# Patient Record
Sex: Female | Born: 1970 | Race: White | Hispanic: No | Marital: Single | Smoking: Never smoker
Health system: Southern US, Community
[De-identification: ages and names within clinical notes are randomized; demographics above are authoritative.]

## PROBLEM LIST (undated history)

## (undated) DIAGNOSIS — Z9889 Other specified postprocedural states: Secondary | ICD-10-CM

## (undated) DIAGNOSIS — F329 Major depressive disorder, single episode, unspecified: Secondary | ICD-10-CM

## (undated) DIAGNOSIS — D509 Iron deficiency anemia, unspecified: Secondary | ICD-10-CM

## (undated) DIAGNOSIS — I10 Essential (primary) hypertension: Secondary | ICD-10-CM

## (undated) DIAGNOSIS — F32A Depression, unspecified: Secondary | ICD-10-CM

## (undated) DIAGNOSIS — F419 Anxiety disorder, unspecified: Secondary | ICD-10-CM

## (undated) DIAGNOSIS — M1990 Unspecified osteoarthritis, unspecified site: Secondary | ICD-10-CM

## (undated) DIAGNOSIS — R079 Chest pain, unspecified: Secondary | ICD-10-CM

## (undated) DIAGNOSIS — R112 Nausea with vomiting, unspecified: Secondary | ICD-10-CM

## (undated) DIAGNOSIS — N200 Calculus of kidney: Secondary | ICD-10-CM

## (undated) DIAGNOSIS — K219 Gastro-esophageal reflux disease without esophagitis: Secondary | ICD-10-CM

## (undated) DIAGNOSIS — E669 Obesity, unspecified: Secondary | ICD-10-CM

## (undated) DIAGNOSIS — J189 Pneumonia, unspecified organism: Secondary | ICD-10-CM

## (undated) HISTORY — DX: Anxiety disorder, unspecified: F41.9

## (undated) HISTORY — DX: Essential (primary) hypertension: I10

## (undated) HISTORY — DX: Gastro-esophageal reflux disease without esophagitis: K21.9

## (undated) HISTORY — DX: Depression, unspecified: F32.A

## (undated) HISTORY — DX: Obesity, unspecified: E66.9

## (undated) HISTORY — DX: Major depressive disorder, single episode, unspecified: F32.9

## (undated) HISTORY — DX: Unspecified osteoarthritis, unspecified site: M19.90

---

## 1997-10-21 ENCOUNTER — Other Ambulatory Visit: Admission: RE | Admit: 1997-10-21 | Discharge: 1997-10-21 | Payer: Self-pay | Admitting: Obstetrics and Gynecology

## 1997-11-13 ENCOUNTER — Other Ambulatory Visit: Admission: RE | Admit: 1997-11-13 | Discharge: 1997-11-13 | Payer: Self-pay | Admitting: Obstetrics and Gynecology

## 1997-12-14 ENCOUNTER — Other Ambulatory Visit: Admission: RE | Admit: 1997-12-14 | Discharge: 1997-12-14 | Payer: Self-pay | Admitting: Obstetrics and Gynecology

## 1998-03-31 ENCOUNTER — Other Ambulatory Visit: Admission: RE | Admit: 1998-03-31 | Discharge: 1998-03-31 | Payer: Self-pay | Admitting: Obstetrics and Gynecology

## 1998-06-07 ENCOUNTER — Other Ambulatory Visit: Admission: RE | Admit: 1998-06-07 | Discharge: 1998-06-07 | Payer: Self-pay | Admitting: Obstetrics and Gynecology

## 1998-10-01 DIAGNOSIS — J189 Pneumonia, unspecified organism: Secondary | ICD-10-CM

## 1998-10-01 HISTORY — DX: Pneumonia, unspecified organism: J18.9

## 1998-10-26 ENCOUNTER — Other Ambulatory Visit: Admission: RE | Admit: 1998-10-26 | Discharge: 1998-10-26 | Payer: Self-pay | Admitting: Obstetrics and Gynecology

## 1998-11-02 ENCOUNTER — Encounter: Admission: RE | Admit: 1998-11-02 | Discharge: 1999-01-31 | Payer: Self-pay | Admitting: Obstetrics & Gynecology

## 1999-01-31 HISTORY — PX: BREAST REDUCTION SURGERY: SHX8

## 1999-03-11 ENCOUNTER — Other Ambulatory Visit: Admission: RE | Admit: 1999-03-11 | Discharge: 1999-03-11 | Payer: Self-pay | Admitting: Obstetrics and Gynecology

## 1999-04-12 ENCOUNTER — Encounter (INDEPENDENT_AMBULATORY_CARE_PROVIDER_SITE_OTHER): Payer: Self-pay

## 1999-04-12 ENCOUNTER — Other Ambulatory Visit: Admission: RE | Admit: 1999-04-12 | Discharge: 1999-04-12 | Payer: Self-pay | Admitting: Unknown Physician Specialty

## 1999-08-22 ENCOUNTER — Other Ambulatory Visit: Admission: RE | Admit: 1999-08-22 | Discharge: 1999-08-22 | Payer: Self-pay | Admitting: Plastic Surgery

## 1999-11-11 ENCOUNTER — Other Ambulatory Visit: Admission: RE | Admit: 1999-11-11 | Discharge: 1999-11-11 | Payer: Self-pay | Admitting: Obstetrics and Gynecology

## 2002-05-26 ENCOUNTER — Other Ambulatory Visit: Admission: RE | Admit: 2002-05-26 | Discharge: 2002-05-26 | Payer: Self-pay | Admitting: Obstetrics and Gynecology

## 2002-07-16 ENCOUNTER — Encounter: Payer: Self-pay | Admitting: Obstetrics and Gynecology

## 2002-07-16 ENCOUNTER — Ambulatory Visit (HOSPITAL_COMMUNITY): Admission: RE | Admit: 2002-07-16 | Discharge: 2002-07-16 | Payer: Self-pay | Admitting: Obstetrics and Gynecology

## 2002-08-14 ENCOUNTER — Ambulatory Visit (HOSPITAL_COMMUNITY): Admission: RE | Admit: 2002-08-14 | Discharge: 2002-08-14 | Payer: Self-pay | Admitting: Obstetrics and Gynecology

## 2002-08-14 ENCOUNTER — Encounter: Payer: Self-pay | Admitting: Obstetrics and Gynecology

## 2002-09-16 ENCOUNTER — Encounter: Payer: Self-pay | Admitting: Obstetrics and Gynecology

## 2002-09-16 ENCOUNTER — Ambulatory Visit (HOSPITAL_COMMUNITY): Admission: RE | Admit: 2002-09-16 | Discharge: 2002-09-16 | Payer: Self-pay | Admitting: Obstetrics and Gynecology

## 2002-10-28 ENCOUNTER — Ambulatory Visit (HOSPITAL_COMMUNITY): Admission: RE | Admit: 2002-10-28 | Discharge: 2002-10-28 | Payer: Self-pay | Admitting: Obstetrics and Gynecology

## 2002-10-28 ENCOUNTER — Encounter: Payer: Self-pay | Admitting: Obstetrics and Gynecology

## 2002-11-05 ENCOUNTER — Inpatient Hospital Stay (HOSPITAL_COMMUNITY): Admission: AD | Admit: 2002-11-05 | Discharge: 2002-11-05 | Payer: Self-pay | Admitting: Obstetrics and Gynecology

## 2002-11-07 ENCOUNTER — Encounter: Admission: RE | Admit: 2002-11-07 | Discharge: 2002-11-07 | Payer: Self-pay | Admitting: Obstetrics and Gynecology

## 2002-11-10 ENCOUNTER — Encounter: Admission: RE | Admit: 2002-11-10 | Discharge: 2002-11-10 | Payer: Self-pay | Admitting: Obstetrics and Gynecology

## 2002-11-13 ENCOUNTER — Encounter: Admission: RE | Admit: 2002-11-13 | Discharge: 2002-11-13 | Payer: Self-pay | Admitting: Obstetrics and Gynecology

## 2002-11-18 ENCOUNTER — Encounter: Admission: RE | Admit: 2002-11-18 | Discharge: 2002-11-18 | Payer: Self-pay | Admitting: Obstetrics and Gynecology

## 2002-11-21 ENCOUNTER — Inpatient Hospital Stay (HOSPITAL_COMMUNITY): Admission: RE | Admit: 2002-11-21 | Discharge: 2002-11-24 | Payer: Self-pay | Admitting: Obstetrics and Gynecology

## 2002-11-21 ENCOUNTER — Encounter (INDEPENDENT_AMBULATORY_CARE_PROVIDER_SITE_OTHER): Payer: Self-pay

## 2003-08-12 ENCOUNTER — Other Ambulatory Visit: Admission: RE | Admit: 2003-08-12 | Discharge: 2003-08-12 | Payer: Self-pay | Admitting: Gynecology

## 2003-08-26 ENCOUNTER — Encounter: Admission: RE | Admit: 2003-08-26 | Discharge: 2003-08-26 | Payer: Self-pay | Admitting: Neurosurgery

## 2003-09-18 ENCOUNTER — Encounter: Admission: RE | Admit: 2003-09-18 | Discharge: 2003-09-18 | Payer: Self-pay | Admitting: Neurosurgery

## 2003-10-16 ENCOUNTER — Encounter: Admission: RE | Admit: 2003-10-16 | Discharge: 2003-10-16 | Payer: Self-pay | Admitting: Neurosurgery

## 2004-01-31 HISTORY — PX: LUMBAR MICRODISCECTOMY: SHX99

## 2004-02-19 ENCOUNTER — Ambulatory Visit (HOSPITAL_COMMUNITY): Admission: RE | Admit: 2004-02-19 | Discharge: 2004-02-19 | Payer: Self-pay | Admitting: Neurosurgery

## 2004-03-13 ENCOUNTER — Emergency Department (HOSPITAL_COMMUNITY): Admission: EM | Admit: 2004-03-13 | Discharge: 2004-03-13 | Payer: Self-pay | Admitting: Emergency Medicine

## 2004-03-14 ENCOUNTER — Inpatient Hospital Stay (HOSPITAL_COMMUNITY): Admission: AD | Admit: 2004-03-14 | Discharge: 2004-03-16 | Payer: Self-pay | Admitting: Neurosurgery

## 2004-09-12 ENCOUNTER — Other Ambulatory Visit: Admission: RE | Admit: 2004-09-12 | Discharge: 2004-09-12 | Payer: Self-pay | Admitting: Gynecology

## 2005-04-12 ENCOUNTER — Encounter: Admission: RE | Admit: 2005-04-12 | Discharge: 2005-04-12 | Payer: Self-pay | Admitting: Gynecology

## 2005-11-03 ENCOUNTER — Other Ambulatory Visit: Admission: RE | Admit: 2005-11-03 | Discharge: 2005-11-03 | Payer: Self-pay | Admitting: Gynecology

## 2006-01-30 HISTORY — PX: CHOLECYSTECTOMY: SHX55

## 2006-06-01 ENCOUNTER — Ambulatory Visit (HOSPITAL_COMMUNITY): Admission: RE | Admit: 2006-06-01 | Discharge: 2006-06-01 | Payer: Self-pay | Admitting: General Surgery

## 2006-06-22 ENCOUNTER — Encounter: Admission: RE | Admit: 2006-06-22 | Discharge: 2006-06-22 | Payer: Self-pay | Admitting: Gynecology

## 2006-11-30 ENCOUNTER — Other Ambulatory Visit: Admission: RE | Admit: 2006-11-30 | Discharge: 2006-11-30 | Payer: Self-pay | Admitting: Gynecology

## 2007-01-31 HISTORY — PX: LAPAROSCOPIC GASTRIC BANDING: SHX1100

## 2007-04-25 ENCOUNTER — Encounter: Admission: RE | Admit: 2007-04-25 | Discharge: 2007-07-24 | Payer: Self-pay | Admitting: Gynecology

## 2007-05-13 ENCOUNTER — Ambulatory Visit (HOSPITAL_COMMUNITY): Admission: RE | Admit: 2007-05-13 | Discharge: 2007-05-14 | Payer: Self-pay | Admitting: Surgery

## 2008-01-10 ENCOUNTER — Other Ambulatory Visit: Admission: RE | Admit: 2008-01-10 | Discharge: 2008-01-10 | Payer: Self-pay | Admitting: Gynecology

## 2008-01-31 HISTORY — PX: IUD REMOVAL: SHX5392

## 2009-09-02 ENCOUNTER — Observation Stay (HOSPITAL_COMMUNITY): Admission: EM | Admit: 2009-09-02 | Discharge: 2009-09-03 | Payer: Self-pay | Admitting: Emergency Medicine

## 2009-10-06 ENCOUNTER — Ambulatory Visit: Payer: Self-pay | Admitting: Cardiology

## 2009-11-26 ENCOUNTER — Encounter: Admission: RE | Admit: 2009-11-26 | Discharge: 2009-11-26 | Payer: Self-pay | Admitting: General Surgery

## 2010-02-19 ENCOUNTER — Encounter: Payer: Self-pay | Admitting: Neurosurgery

## 2010-02-20 ENCOUNTER — Emergency Department (HOSPITAL_COMMUNITY)
Admission: EM | Admit: 2010-02-20 | Discharge: 2010-02-20 | Payer: Self-pay | Source: Home / Self Care | Admitting: Emergency Medicine

## 2010-03-05 ENCOUNTER — Emergency Department (HOSPITAL_COMMUNITY)
Admission: EM | Admit: 2010-03-05 | Discharge: 2010-03-06 | Disposition: A | Discharge status: Home / Self Care | Payer: PRIVATE HEALTH INSURANCE | Attending: Emergency Medicine | Admitting: Emergency Medicine

## 2010-03-05 DIAGNOSIS — J45909 Unspecified asthma, uncomplicated: Secondary | ICD-10-CM | POA: Insufficient documentation

## 2010-03-05 DIAGNOSIS — R000 Tachycardia, unspecified: Secondary | ICD-10-CM | POA: Insufficient documentation

## 2010-03-05 DIAGNOSIS — R0602 Shortness of breath: Secondary | ICD-10-CM | POA: Insufficient documentation

## 2010-03-08 ENCOUNTER — Other Ambulatory Visit: Payer: Self-pay | Admitting: Gastroenterology

## 2010-03-15 ENCOUNTER — Ambulatory Visit
Admission: RE | Admit: 2010-03-15 | Discharge: 2010-03-15 | Disposition: A | Discharge status: Home / Self Care | Payer: PRIVATE HEALTH INSURANCE | Source: Ambulatory Visit | Attending: Gastroenterology | Admitting: Gastroenterology

## 2010-03-15 ENCOUNTER — Ambulatory Visit: Payer: PRIVATE HEALTH INSURANCE

## 2010-03-15 ENCOUNTER — Other Ambulatory Visit: Payer: Self-pay | Admitting: Gastroenterology

## 2010-04-15 LAB — URINALYSIS, ROUTINE W REFLEX MICROSCOPIC
Bilirubin Urine: NEGATIVE
Glucose, UA: NEGATIVE mg/dL
Ketones, ur: NEGATIVE mg/dL
Urobilinogen, UA: 0.2 mg/dL (ref 0.0–1.0)
pH: 8 (ref 5.0–8.0)

## 2010-04-15 LAB — CK TOTAL AND CKMB (NOT AT ARMC): Relative Index: INVALID (ref 0.0–2.5)

## 2010-04-15 LAB — BASIC METABOLIC PANEL
BUN: 10 mg/dL (ref 6–23)
CO2: 28 mEq/L (ref 19–32)
Calcium: 8.1 mg/dL — ABNORMAL LOW (ref 8.4–10.5)
Chloride: 102 mEq/L (ref 96–112)
Chloride: 103 mEq/L (ref 96–112)
Creatinine, Ser: 0.84 mg/dL (ref 0.4–1.2)
Creatinine, Ser: 0.85 mg/dL (ref 0.4–1.2)
GFR calc Af Amer: >60 mL/min (ref >60)
GFR calc non Af Amer: >60 mL/min (ref >60)
Glucose, Bld: 87 mg/dL (ref 70–99)
Sodium: 136 mEq/L (ref 135–145)
Sodium: 139 mEq/L (ref 135–145)

## 2010-04-15 LAB — GLUCOSE, CAPILLARY
Glucose-Capillary: 114 mg/dL — ABNORMAL HIGH (ref 70–99)
Glucose-Capillary: 99 mg/dL (ref 70–99)

## 2010-04-15 LAB — POCT CARDIAC MARKERS: Myoglobin, poc: 73.9 ng/mL (ref 12–200)

## 2010-04-15 LAB — TSH: TSH: 1.158 u[IU]/mL (ref 0.350–4.500)

## 2010-04-15 LAB — CBC
HCT: 41.7 % (ref 36.0–46.0)
Hemoglobin: 13 g/dL (ref 12.0–15.0)
Hemoglobin: 13.8 g/dL (ref 12.0–15.0)
MCH: 30.9 pg (ref 26.0–34.0)
MCV: 93.1 fL (ref 78.0–100.0)
Platelets: 271 10*3/uL (ref 150–400)
Platelets: 304 10*3/uL (ref 150–400)
RBC: 4.21 MIL/uL (ref 3.87–5.11)
WBC: 12 10*3/uL — ABNORMAL HIGH (ref 4.0–10.5)

## 2010-04-15 LAB — COMPREHENSIVE METABOLIC PANEL
ALT: 18 U/L (ref 0–35)
Albumin: 3.6 g/dL (ref 3.5–5.2)
GFR calc Af Amer: >60 mL/min (ref >60)
Potassium: 4.6 mEq/L (ref 3.5–5.1)
Total Protein: 6.8 g/dL (ref 6.0–8.3)

## 2010-04-15 LAB — CARDIAC PANEL(CRET KIN+CKTOT+MB+TROPI)
Relative Index: INVALID (ref 0.0–2.5)
Relative Index: INVALID (ref 0.0–2.5)
Total CK: 29 U/L (ref 7–177)
Troponin I: 0.01 ng/mL (ref 0.00–0.06)

## 2010-04-15 LAB — DIFFERENTIAL: Monocytes Absolute: 0.6 10*3/uL (ref 0.1–1.0)

## 2010-04-15 LAB — D-DIMER, QUANTITATIVE: D-Dimer, Quant: 0.22 ug/mL-FEU (ref 0.00–0.48)

## 2010-04-15 LAB — PROTIME-INR: Prothrombin Time: 12.9 seconds (ref 11.6–15.2)

## 2010-04-15 LAB — LIPID PANEL
Cholesterol: 154 mg/dL (ref 0–200)
HDL: 61 mg/dL (ref >39)
Total CHOL/HDL Ratio: 2.5 RATIO
Triglycerides: 117 mg/dL (ref <150)

## 2010-04-15 LAB — MRSA PCR SCREENING: MRSA by PCR: NEGATIVE

## 2010-04-15 LAB — HEMOGLOBIN A1C: Mean Plasma Glucose: 103 mg/dL (ref <117)

## 2010-05-17 ENCOUNTER — Other Ambulatory Visit: Payer: Self-pay | Admitting: Gynecology

## 2010-05-17 DIAGNOSIS — N6312 Unspecified lump in the right breast, upper inner quadrant: Secondary | ICD-10-CM

## 2010-05-23 ENCOUNTER — Ambulatory Visit
Admission: RE | Admit: 2010-05-23 | Discharge: 2010-05-23 | Disposition: A | Discharge status: Home / Self Care | Payer: PRIVATE HEALTH INSURANCE | Source: Ambulatory Visit | Attending: Gynecology | Admitting: Gynecology

## 2010-05-23 ENCOUNTER — Other Ambulatory Visit: Payer: Self-pay | Admitting: Gynecology

## 2010-05-23 DIAGNOSIS — N6312 Unspecified lump in the right breast, upper inner quadrant: Secondary | ICD-10-CM

## 2010-06-14 NOTE — Op Note (Signed)
Natasha [name]                 ACCOUNT NO.:  [id number]   MEDICAL RECORD NO.:  [id number]          PATIENT TYPE:  AMB   LOCATION:  DAY                          FACILITY:  [facility]   PHYSICIAN:  Sharlet Salina T. [name], M.D.DATE OF BIRTH:  27-May-1970   DATE OF PROCEDURE:  05/13/2007  DATE OF DISCHARGE:                               OPERATIVE REPORT   PREOPERATIVE DIAGNOSIS:  Morbid obesity.   POSTOPERATIVE DIAGNOSIS:  Morbid obesity.   SURGICAL PROCEDURES:  Placement of laparoscopic adjustable gastric band.   SURGEON:  Natasha [name], M.D.   ASSISTANT:  Thornton Park. Daphine Deutscher, M.D.   ANESTHESIA:  General.   BRIEF HISTORY:  Natasha [name] is a 40 year old female with progressive  morbid obesity unresponsive to medical management and several  comorbidities.  After extensive preoperative workup and discussion  detailed elsewhere, we have elected to proceed with placement of  laparoscopic adjustable gastric band.   DESCRIPTION OF OPERATION:  The patient was brought to the operating room  and placed in the supine position on the operating table, and general  orotracheal anesthesia was induced.  She received preoperative IV  antibiotics.  PAS were in place.  Subcutaneous heparin had been  administered preoperatively.  The abdomen was widely sterilely prepped  and draped.  Correct patient and procedure were verified.  Access was  obtained in the left subcostal space with 11-mm OptiVu trocar without  difficulty.  Under direct vision, a 15-mm trocar was placed in the right  upper quadrant laterally, an 11-mm trocar in the right upper mid  abdomen, and an 11-mm trocar just above and to the left of the umbilicus  for the camera port.  Through a 5-mm subxiphoid port, the Nathanson  retractor was placed, and the left lobe of liver elevated with excellent  exposure of the hiatus and upper stomach.  Finally, a 5-mm trocar was  placed in the left flank.  Upper GI series had shown no evidence of  hiatal hernia, and there did not appear to be any gross evidence of  this.  The peritoneum at the angle of His overlying the left crus was  incised, and blunt dissection carried back along the left crus toward  the retrogastric space.  The gastrohepatic omentum was then divided in  an avascular area, and the crossing fat was identified at the base of  the right crus.  Peritoneum was incised with cautery, and using the  finger dissector, this passed easily into the retrogastric space and was  deployed up to the previously dissected area of the angle of His without  difficulty.  A prefilled AP Standard lap band system was introduced,  tubing passed through the finger dissector which was then brought back  through the retrogastric space without difficulty as was the band.  The  sizing tube was placed orally into the stomach, and the band was clipped  into place without any undue tension.  The sizing tube was removed.  Holding the tubing toward the patient's feet, the fundus was imbricated  up over the band to the small gastric pouch with several interrupted  2-0  Ethibond sutures.  The operative site was inspected for hemostasis which  appeared complete.  There was no evidence of trocar injury.  The tubing  was brought out through the right mid abdominal trocar site.  The  Nathanson retractor was removed, all CO2 evacuated, trocars removed.  This right mid abdominal incision was lengthened somewhat and the  anterior fascia exposed for placement of the port.  The tubing was  trimmed, attached to the port, which was then sutured to the  anterior abdominal wall with 4 interrupted Prolene sutures.  Following  this, this wound was closed with running subcutaneous 3-0 Vicryl, and  skin incisions were closed with subcuticular 4-0 Monocryl with  Dermabond.  Sponge, needle, and instrument counts correct.  The patient  was taken to recovery in good condition.      Natasha [name], M.D.   Electronically Signed     BTH/MEDQ  D:  05/13/2007  T:  05/13/2007  Job:  [id number]

## 2010-06-17 NOTE — Op Note (Signed)
NAMESHAWNIA, VIZCARRONDO NO.:  [id number]   MEDICAL RECORD NO.:  [id number]                   PATIENT TYPE:  INP   LOCATION:  9198                                 FACILITY:  [facility]   PHYSICIAN:  Huel Cote, M.D.              DATE OF BIRTH:  1970/11/14   DATE OF PROCEDURE:  11/21/2002  DATE OF DISCHARGE:                                 OPERATIVE REPORT   PREOPERATIVE DIAGNOSES:  1. Term pregnancy at 37+ weeks, delivered.  2. Twin gestation.  3. Breech-vertex position.   POSTOPERATIVE DIAGNOSES:  1. Term pregnancy at 37+ weeks, delivered.  2. Twin gestation.  3. Breech-vertex position.   PROCEDURE:  Low transverse cesarean section.   SURGEON:  Huel Cote, M.D., with Leatha Gilding. Mezer, M.D.   ANESTHESIA:  Spinal.   ESTIMATED BLOOD LOSS:  1000 mL.   URINE OUTPUT:  200 mL clear urine.   FLUIDS REPLACED:  Approximately 1700 mL LR.   FINDINGS:  There was a vigorous female infant, baby A, in the breech  presentation, 6 pounds 5 ounces; a vigorous female, baby B, in the vertex  presentation, 6 pounds 8 ounces.  The placenta was normal.  Tubes and  ovaries were normal.   DESCRIPTION OF PROCEDURE:  The patient was taken to the operating room,  where spinal anesthesia was obtained without difficulty.  She was then  prepped and draped in the normal sterile fashion in the dorsal supine  position with a leftward tilt.  A Pfannenstiel skin incision was then made  with a scalpel and carried through to the underlying layer of fascia by  sharp dissection and Bovie cautery.  The fascia was then nicked in the  midline and the incision was extended laterally with Mayo scissors.  The  inferior aspect of the fascia was then grasped with Kocher clamps, elevated,  and dissected off the underlying rectus muscles.  In a similar fashion the  superior aspect was elevated and dissected off the rectus muscles.  The  rectus muscles were separated in the midline and  the peritoneum identified  and entered bluntly.  The peritoneal incision was extended both superiorly  and inferiorly with careful attention to avoid both bowel and bladder.  The  bladder blade was inserted.  The vesicouterine peritoneum was identified.  The uterus was noted to be rotated to the patient's left, and this was  adjusted for and a bladder flap created with Metzenbaum scissors.  The  bladder blade was placed underneath the bladder flap and the uterus incised  in a transverse fashion.  The cavity itself was entered bluntly and clear  fluid noted.  Baby A was noted to be in the breech presentation, frank  breech with the buttocks presenting.  These were easily elevated and  delivered with the legs reduced and the arms reduced and the head delivered  easily.  The nose and  mouth were bulb-suctioned, the cord clamped and cut,  and the baby handed to the waiting pediatricians.  Baby B was then palpated  and was noted to be in the vertex presentation.  Head was elevated through  the incision and the bag on baby B was opened with a hemostat.  The vertex  was easily delivered, the nose and mouth bulb-suctioned, and the remainder  of the infant's body delivered atraumatically.  The cord was clamped and cut  and the baby was taken to the [facility] as well.  At this time a cord blood  was obtained from cord A, and this was marked with a plastic umbilical  clamp.  Then a cord blood was attempted to be obtained from baby B, which  was a two-vessel cord, and some blood was obtained though not a large  amount.  The placenta was then delivered manually and the uterus cleared of  all clots and debris with a moist lap sponge.  The uterine incision was then  grasped with ring forceps and repaired with 0 chromic in a running locked  fashion.  Excellent hemostasis was noted after the closure.  The gutters  were cleared of all clots and debris with a moist lap sponge.  Tubes and  ovaries were inspected  and found to be normal.  The bladder flap was  inspected and found to be normal.  There was no active bleeding noted;  therefore, all instruments and sponges were removed from the abdomen after  irrigation was performed.  The muscles were reapproximated with 0 Vicryl in  several interrupted sutures.  The fascia was then closed with 0 Vicryl in a  running fashion.  Subcutaneous tissue was reapproximated with 0 plain in a  running fashion, and the skin was closed with staples.  Sponge, lap, and  needle counts were again correct x2, and the patient was taken to the  recovery room in stable condition.                                                Huel Cote, M.D.    KR/MEDQ  D:  11/21/2002  T:  11/21/2002  Job:  [id number]

## 2010-06-17 NOTE — Op Note (Signed)
[name], [name] NO.:  [id number]   MEDICAL RECORD NO.:  [id number]          PATIENT TYPE:  OIB   LOCATION:  2899                         FACILITY:  [facility]   PHYSICIAN:  Kathaleen Maser. Pool, M.D.    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   PROCEDURE:  Right L5-S1 laminotomy and microdiskectomy.   SURGEON:  Kathaleen Maser. Pool, M.D.   ASSISTANT:  Reinaldo Meeker, M.D.   ANESTHESIA:  General endotracheal anesthesia.   INDICATIONS FOR PROCEDURE:  [name] is a 40 year old female with history  of back and bilateral lower extremity pain, right greater than left.  Work-  ups demonstrated evidence of a large right paracentral disk herniation at L5-  S1 with compression of thecal sac and both exiting S1 nerve roots.  We  discussed the options for management including undergoing a right-sided L5-  S1 laminotomy and microdiskectomy in hopes of improving her symptoms. The  patient aware of the risks and benefits involved with surgery and wishes to  proceed.   DESCRIPTION OF PROCEDURE:  The patient taken to the operating room and  placed in the supine position.  After adequate level of anesthesia achieved,  the patient was positioned prone onto the Wilson frame and appropriately  padded. The patient's lumbar region was prepped and draped sterilely.  A 10  blade was used to make a linear skin incision overlying the L5-S1  interspace.  This was carried down sharply in the midline.  A subperiosteal  dissection performed, exposing the lamina of facet joints at L5 and S1 along  the right.  A deep self-retaining retractor was placed.  Intraoperative x-  ray was taken and level was confirmed.  Laminotomy was then  performed using  high speed drill and Kerrison rongeurs to removal the inferior aspect of the  lamina of L5, medial aspect of L5-S1 facet joint, and the superior rim of  the S1 lamina.  Ligamentum flavum was then elevated and resected in  piecemeal fashion using Kerrison rongeurs.  Underlying thecal sac was then  identified as was the exiting right-sided S1 nerve root.  Epidural venous  plexus was coagulated and cut.  Microscope was brought into the field and  used throughout the remainder of the diskectomy for microdissection.  Thecal  sac and S1 nerve root were mobilized and retracted toward the midline.  Disk  herniation was readily apparent.  This was then incised with a 15 blade in a  rectangular fashion.  A wide disk space clean-out was achieved using  pituitary rongeurs, up-angled pituitary rongeurs, and Epstein curettes.  All  loose or obviously degenerative disk material was removed from the  interspace.  All elements of the disk herniation were completely resected.  At this point a very thorough decompression was achieved.  There was no  evidence of injury  to thecal or nerve roots.  Wound was then irrigated with  antibiotic solution.  Gelfoam was placed for topically for hemostasis.  Microscope and retractors were removed.  Hemostasis  in the muscle achieved with electrocautery.  The wound was then closed in  layers with Vicryl sutures.  Steri-Strips and sterile dressing were applied.  There were no perioperative complaints.  The patient tolerated the procedure  well and she returned to the recovery room postoperatively.       HAP/MEDQ  D:  02/19/2004  T:  02/19/2004  Job:  [id number]

## 2010-06-17 NOTE — Discharge Summary (Signed)
Natasha [name], Natasha [name]                             ACCOUNT NO.:  [id number]   MEDICAL RECORD NO.:  [id number]                   PATIENT TYPE:  INP   LOCATION:  9131                                 FACILITY:  [facility]   PHYSICIAN:  Huel Cote, M.D.              DATE OF BIRTH:  11-22-1970   DATE OF ADMISSION:  11/21/2002  DATE OF DISCHARGE:  11/24/2002                                 DISCHARGE SUMMARY   DISCHARGE DIAGNOSES:  1. Term pregnancy at 37 plus weeks delivered.  2. Twin gestation.  3. Breech vertex presentation.  4. Status post low transverse cesarean section.   DISCHARGE MEDICATIONS:  1. Motrin 600 mg p.o. q.6h. p.r.n.  2. Percocet 1-2 tablets p.o. q.4h. p.r.n.  3. Pyridium one tablet p.o. three times daily.   HOSPITAL COURSE:  Briefly, the patient was a 39 year old, G2, P0-0-1-0, who  was admitted for a scheduled cesarean section, given a twin pregnancy with a  breech vertex presentation of the infants.  She underwent the surgery on  November 21, 2002 without difficulty, and was delivered of a vigorous female  infant from the breech presentation as baby A, 6 pounds, 5 ounces, and  delivered a vigorous female infant in the vertex presentation, baby B, which  was 6 pounds, 8 ounces.  She had a normal uterus, tubes, and ovaries, and  was then admitted for routine postoperative care.  Her postoperative  hemoglobin was 8.8.  Her blood pressures remained stable, and she restarted  her Aldomet for her chronic hypertension.  Prior to discharge, on  postoperative day #3, she was doing very well.  She was having some bladder  spasm, which responded to Pyridium.  She was tolerating a regular diet, and  her pain was well controlled.  She was afebrile with stable vital signs.  Abdomen was soft and nontender.  Her incision was clear with no erythema  noted, and steri-strips were placed, and she was felt stable for discharge  home.                                               Huel Cote, M.D.    KR/MEDQ  D:  12/30/2002  T:  12/30/2002  Job:  [id number]

## 2010-06-17 NOTE — H&P (Signed)
Natasha [name], HOEY NO.:  [id number]   MEDICAL RECORD NO.:  [id number]                   PATIENT TYPE:  INP   LOCATION:  NA                                   FACILITY[facility]   PHYSICIAN:  Huel Cote, M.D.              DATE OF BIRTH:  11-Aug-1970   DATE OF ADMISSION:  11/21/2002  DATE OF DISCHARGE:                                HISTORY & PHYSICAL   PREOPERATIVE HISTORY AND PHYSICAL:   DATE OF SURGERY:  Surgery to take place on November 21, 2002 at [facility] at 9 a.m.   HISTORY OF PRESENT ILLNESS:  The patient is a 40 year old G2, P0-0-1-0, who  is coming in at [redacted] weeks gestation for a scheduled cesarean section for twin  gestation and some mildly elevated blood pressures with a history of chronic  hypertension.  The patient's estimated due date is December 08, 2002, this is  calculated by a known date of conception with insemination performed with  donor sperm.  Her pregnancy is as stated a twin pregnancy and has been  complicated by a two vessel cord in twin B with no other abnormalities  found.  This has been followed by nonstress test since [redacted] weeks gestation.  The patient also has her prenatal care complicated by a history of chronic  hypertension.  This has been very stable and well controlled throughout the  pregnancy on Aldomet 500 mg b.i.d., she also has a history of asthma  however, this has remained fairly stable with only one flare requiring  steroids in her pregnancy.  The patient has concordantly grown twins.  On  October 29, 2002 estimated fetal weight of twin A was 2510 g and estimated  fetal weight of twin B was 2417 g.   PAST OBSTETRICAL HISTORY:  Her past obstetrical history is significant for  this pregnancy which was conceived with gonadotropins and donor sperm and a  previous miscarriage in the first trimester that occurred in 2003.   PAST GYNECOLOGIC HISTORY:  Past GYN history is significant for LEEP in 1999  with Pap smears returning to normal after that point.   PAST MEDICAL HISTORY:  Past medical history significant for the chronic  hypertension, she is on Aldomet 500 mg b.i.d. and has been stable on this  dose throughout pregnancy.  The patient also has a history of iron-  deficiency anemia which does not respond to p.o. medications and does  receive iron transfusions periodically to assist with this.  She has a  history of asthma which is stable on Singulair, albuterol, and Pulmicort.  She has a history of reflux disease for which she takes Nexium.  She also  has history of migraine headaches however, these have not been a problem  currently.   PAST SURGICAL HISTORY:  In 2001 she had a breast reduction and in 1999 the  LEEP procedure.  ALLERGIES:  Allergies include INDERAL which causes shortness of breath and  MIDRIN which causes increased blood pressure.   MEDICATIONS:  Her medications as previously stated - Pulmicort, albuterol,  Singulair, Nexium, Aldomet.   PRENATAL LABORATORIES:  Her prenatal labs are as follows - A positive,  antibody negative, RPR nonreactive, rubella immune, hepatitis B surface  antigen negative, HIV negative, GC negative, Chlamydia negative, triple  screen normal, group B strep negative, 1-hour glucola was normal at 133.   PHYSICAL EXAMINATION:  VITAL SIGNS:  The patient's weight is 274 pounds,  blood pressure is 140/90.  CARDIAC:  Regular rate and rhythm.  LUNGS:  Clear.  ABDOMEN:  Soft, gravid, and nontender.  Fundal height is approximately 43-  44.  CERVICAL:  Exam deferred, was long and closed on last check.   IMPRESSION AND PLAN:  The patient was counseled as to the risks and benefits  of proceeding with cesarean section in detail, she wishes to have cesarean  section given a fetal lie which was breech-breech on last ultrasound and  really has no interest in labor should the twin A rotate to vertex.  The  patient understands the increased risk of  bleeding and infection with  surgery and desires to proceed as stated, she also understands the risk for  possible bowel and bladder damage at the time of surgery and likewise,  desires to continue as stated.                                               Huel Cote, M.D.    KR/MEDQ  D:  11/19/2002  T:  11/19/2002  Job:  [id number]

## 2010-07-18 ENCOUNTER — Telehealth: Payer: Self-pay | Admitting: Cardiology

## 2010-07-18 NOTE — Telephone Encounter (Signed)
Called in wanting to request Shrita Thien to be her next physician. Please call back and follow-up with her. If you say it's a doctor's office they will page her.

## 2010-07-19 NOTE — Telephone Encounter (Signed)
Left message for pt on cell # that Kassie is ok to see but we need to align pt with a physician.  Pt told to call back with any concerns.

## 2010-07-29 ENCOUNTER — Emergency Department (HOSPITAL_COMMUNITY)
Admission: EM | Admit: 2010-07-29 | Discharge: 2010-07-29 | Disposition: A | Discharge status: Home / Self Care | Payer: PRIVATE HEALTH INSURANCE | Attending: Emergency Medicine | Admitting: Emergency Medicine

## 2010-07-29 DIAGNOSIS — F341 Dysthymic disorder: Secondary | ICD-10-CM | POA: Insufficient documentation

## 2010-07-29 DIAGNOSIS — M545 Low back pain, unspecified: Secondary | ICD-10-CM | POA: Insufficient documentation

## 2010-07-29 DIAGNOSIS — Z9889 Other specified postprocedural states: Secondary | ICD-10-CM | POA: Insufficient documentation

## 2010-10-25 LAB — DIFFERENTIAL
Basophils Relative: 0
Eosinophils Absolute: 0
Lymphs Abs: 1.8
Monocytes Absolute: 0.6
Monocytes Relative: 7
Neutrophils Relative %: 72

## 2010-10-25 LAB — CBC
HCT: 36.3
Hemoglobin: 12.2
MCHC: 33.7
MCV: 89.8
RBC: 4.04
WBC: 8.6

## 2010-10-25 LAB — BASIC METABOLIC PANEL
BUN: 15
CO2: 27
Chloride: 101
Creatinine, Ser: 0.72
Glucose, Bld: 91
Potassium: 4.2

## 2010-10-25 LAB — HEMOGLOBIN AND HEMATOCRIT, BLOOD: Hemoglobin: 13.7

## 2010-11-08 ENCOUNTER — Encounter (HOSPITAL_BASED_OUTPATIENT_CLINIC_OR_DEPARTMENT_OTHER): Payer: PRIVATE HEALTH INSURANCE | Admitting: Internal Medicine

## 2010-11-08 ENCOUNTER — Other Ambulatory Visit: Payer: Self-pay | Admitting: Internal Medicine

## 2010-11-08 DIAGNOSIS — D649 Anemia, unspecified: Secondary | ICD-10-CM

## 2010-11-08 DIAGNOSIS — D539 Nutritional anemia, unspecified: Secondary | ICD-10-CM

## 2010-11-08 LAB — CBC & DIFF AND RETIC
Basophils Absolute: 0 10*3/uL (ref 0.0–0.1)
EOS%: 6.1 % (ref 0.0–7.0)
Eosinophils Absolute: 0.5 10*3/uL (ref 0.0–0.5)
HGB: 11.3 g/dL — ABNORMAL LOW (ref 11.6–15.9)
LYMPH%: 34.8 % (ref 14.0–49.7)
MCH: 27.8 pg (ref 25.1–34.0)
MCV: 86.2 fL (ref 79.5–101.0)
MONO%: 7 % (ref 0.0–14.0)
NEUT#: 4 10*3/uL (ref 1.5–6.5)
Platelets: 321 10*3/uL (ref 145–400)
RBC: 4.06 10*6/uL (ref 3.70–5.45)
RDW: 16 % — ABNORMAL HIGH (ref 11.2–14.5)

## 2010-11-10 LAB — PROTEIN ELECTROPHORESIS, SERUM
Alpha-2-Globulin: 12.2 % — ABNORMAL HIGH (ref 7.1–11.8)
Beta 2: 5.9 % (ref 3.2–6.5)
Beta Globulin: 6.6 % (ref 4.7–7.2)
Total Protein, Serum Electrophoresis: 6.7 g/dL (ref 6.0–8.3)

## 2010-11-10 LAB — IRON AND TIBC
%SAT: 6 % — ABNORMAL LOW (ref 20–55)
Iron: 19 ug/dL — ABNORMAL LOW (ref 42–145)
UIBC: 308 ug/dL (ref 125–400)

## 2010-11-10 LAB — FERRITIN: Ferritin: 7 ng/mL — ABNORMAL LOW (ref 10–291)

## 2010-11-10 LAB — LACTATE DEHYDROGENASE: LDH: 114 U/L (ref 94–250)

## 2010-11-10 LAB — COMPREHENSIVE METABOLIC PANEL
Alkaline Phosphatase: 139 U/L — ABNORMAL HIGH (ref 39–117)
BUN: 13 mg/dL (ref 6–23)
CO2: 22 mEq/L (ref 19–32)
Creatinine, Ser: 0.81 mg/dL (ref 0.50–1.10)
Glucose, Bld: 93 mg/dL (ref 70–99)
Total Bilirubin: 0.3 mg/dL (ref 0.3–1.2)

## 2010-11-25 ENCOUNTER — Encounter (HOSPITAL_BASED_OUTPATIENT_CLINIC_OR_DEPARTMENT_OTHER): Payer: PRIVATE HEALTH INSURANCE | Admitting: Internal Medicine

## 2010-11-25 DIAGNOSIS — D649 Anemia, unspecified: Secondary | ICD-10-CM

## 2010-11-25 DIAGNOSIS — D539 Nutritional anemia, unspecified: Secondary | ICD-10-CM

## 2010-12-08 ENCOUNTER — Other Ambulatory Visit: Payer: Self-pay | Admitting: Internal Medicine

## 2010-12-08 DIAGNOSIS — D509 Iron deficiency anemia, unspecified: Secondary | ICD-10-CM | POA: Insufficient documentation

## 2010-12-09 ENCOUNTER — Ambulatory Visit (HOSPITAL_BASED_OUTPATIENT_CLINIC_OR_DEPARTMENT_OTHER): Payer: PRIVATE HEALTH INSURANCE

## 2010-12-09 VITALS — BP 146/78 | HR 97 | Temp 98.1°F

## 2010-12-09 DIAGNOSIS — D509 Iron deficiency anemia, unspecified: Secondary | ICD-10-CM

## 2010-12-09 MED ORDER — SODIUM CHLORIDE 0.9 % IV SOLN
Freq: Once | INTRAVENOUS | Status: AC
  Administered 2010-12-09: 15:00:00 via INTRAVENOUS

## 2010-12-09 MED ORDER — FERUMOXYTOL INJECTION 510 MG/17 ML
510.0000 mg | Freq: Once | INTRAVENOUS | Status: AC
  Administered 2010-12-09: 510 mg via INTRAVENOUS
  Filled 2010-12-09: qty 17

## 2010-12-09 MED ORDER — FERUMOXYTOL INJECTION 510 MG/17 ML
510.0000 mg | Freq: Once | INTRAVENOUS | Status: DC
  Filled 2010-12-09: qty 17

## 2010-12-09 NOTE — Patient Instructions (Signed)
1600-Pt discharged ambulatory with next appointment confirmed.  Pt aware to call with any questions or concerns.

## 2011-01-10 ENCOUNTER — Other Ambulatory Visit: Payer: Self-pay | Admitting: Internal Medicine

## 2011-01-10 ENCOUNTER — Other Ambulatory Visit (HOSPITAL_BASED_OUTPATIENT_CLINIC_OR_DEPARTMENT_OTHER): Payer: PRIVATE HEALTH INSURANCE | Admitting: Lab

## 2011-01-10 DIAGNOSIS — D649 Anemia, unspecified: Secondary | ICD-10-CM

## 2011-01-10 DIAGNOSIS — D539 Nutritional anemia, unspecified: Secondary | ICD-10-CM

## 2011-01-10 LAB — CBC WITH DIFFERENTIAL/PLATELET
Basophils Absolute: 0 10*3/uL (ref 0.0–0.1)
EOS%: 5 % (ref 0.0–7.0)
Eosinophils Absolute: 0.3 10*3/uL (ref 0.0–0.5)
HCT: 40.6 % (ref 34.8–46.6)
HGB: 13.5 g/dL (ref 11.6–15.9)
LYMPH%: 36.6 % (ref 14.0–49.7)
MCH: 30.7 pg (ref 25.1–34.0)
MCV: 92.4 fL (ref 79.5–101.0)
MONO%: 9.4 % (ref 0.0–14.0)
NEUT#: 3.3 10*3/uL (ref 1.5–6.5)
NEUT%: 48.3 % (ref 38.4–76.8)
Platelets: 259 10*3/uL (ref 145–400)

## 2011-01-10 LAB — IRON AND TIBC
%SAT: 23 % (ref 20–55)
Iron: 51 ug/dL (ref 42–145)
TIBC: 226 ug/dL — ABNORMAL LOW (ref 250–470)
UIBC: 175 ug/dL (ref 125–400)

## 2011-01-10 LAB — FERRITIN: Ferritin: 155 ng/mL (ref 10–291)

## 2011-01-13 ENCOUNTER — Other Ambulatory Visit: Payer: PRIVATE HEALTH INSURANCE | Admitting: Lab

## 2011-01-17 ENCOUNTER — Encounter: Payer: Self-pay | Admitting: *Deleted

## 2011-01-17 ENCOUNTER — Ambulatory Visit (HOSPITAL_BASED_OUTPATIENT_CLINIC_OR_DEPARTMENT_OTHER): Payer: PRIVATE HEALTH INSURANCE | Admitting: Internal Medicine

## 2011-01-17 VITALS — BP 135/83 | HR 96 | Temp 98.2°F | Wt 232.5 lb

## 2011-01-17 DIAGNOSIS — D509 Iron deficiency anemia, unspecified: Secondary | ICD-10-CM

## 2011-01-17 NOTE — Progress Notes (Signed)
[facility] OFFICE PROGRESS NOTE  Natasha Pen, MD [address] [place] [address]  DIAGNOSIS: Iron deficiency anemia  PRIOR THERAPY: Feraheme infusion for 2 doses, last dose was given 11/25/2010  CURRENT THERAPY: Observation.  INTERVAL HISTORY: Natasha [name] 40 y.o. female returns to the clinic today for followup visit. The patient came to the clinic today for evaluation and discussion of her treatment for deficiency anemia. She has significant improvement in his condition after the Feraheme infusion except for some aching pain. She denied having any significant dizzy spells denied having any chest pain or shortness of breath, no weight loss or night sweats. She had repeat CBC and iron study performed recently and she is here for evaluation and discussion of her lab results.  MEDICAL HISTORY:No past medical history on file.  ALLERGIES:   has no allergies on file.  MEDICATIONS:  No current outpatient prescriptions on file.    REVIEW OF SYSTEMS:  A comprehensive review of systems was negative.   PHYSICAL EXAMINATION: General appearance: alert, cooperative and no distress Head: Normocephalic, without obvious abnormality, atraumatic Neck: no adenopathy Lymph nodes: Cervical, supraclavicular, and axillary nodes normal. Resp: clear to auscultation bilaterally Cardio: regular rate and rhythm, S1, S2 normal, no murmur, click, rub or gallop GI: soft, non-tender; bowel sounds normal; no masses,  no organomegaly Extremities: extremities normal, atraumatic, no cyanosis or edema Neurologic: Alert and oriented X 3, normal strength and tone. Normal symmetric reflexes. Normal coordination and gait  ECOG PERFORMANCE STATUS: 0 - Asymptomatic  Blood pressure 135/83, pulse 96, temperature 98.2 F (36.8 C), temperature source Oral, weight 232 lb 8 oz (105.461 kg).  LABORATORY DATA: Lab Results  Component Value Date   WBC 6.9 01/10/2011   HGB 13.5 01/10/2011   HCT 40.6  01/10/2011   MCV 92.4 01/10/2011   PLT 259 01/10/2011      Chemistry      Component Value Date/Time   NA 141 11/08/2010 1337   NA 141 11/08/2010 1337   NA 141 11/08/2010 1337   K 3.6 11/08/2010 1337   K 3.6 11/08/2010 1337   K 3.6 11/08/2010 1337   CL 110 11/08/2010 1337   CL 110 11/08/2010 1337   CL 110 11/08/2010 1337   CO2 22 11/08/2010 1337   CO2 22 11/08/2010 1337   CO2 22 11/08/2010 1337   BUN 13 11/08/2010 1337   BUN 13 11/08/2010 1337   BUN 13 11/08/2010 1337   CREATININE 0.81 11/08/2010 1337   CREATININE 0.81 11/08/2010 1337   CREATININE 0.81 11/08/2010 1337      Component Value Date/Time   CALCIUM 8.5 11/08/2010 1337   CALCIUM 8.5 11/08/2010 1337   CALCIUM 8.5 11/08/2010 1337   ALKPHOS 139* 11/08/2010 1337   ALKPHOS 139* 11/08/2010 1337   ALKPHOS 139* 11/08/2010 1337   AST 12 11/08/2010 1337   AST 12 11/08/2010 1337   AST 12 11/08/2010 1337   ALT 10 11/08/2010 1337   ALT 10 11/08/2010 1337   ALT 10 11/08/2010 1337   BILITOT 0.3 11/08/2010 1337   BILITOT 0.3 11/08/2010 1337   BILITOT 0.3 11/08/2010 1337         Range  1wk ago (01/10/11)  1wk ago (01/10/11)  71mo ago (11/08/10)  71mo ago (11/08/10)  71mo ago (11/08/10)     Iron  42 - 145 ug/dL  51   51   19 (L)   19 (L)   19 (L)      UIBC  125 - 400 ug/dL  161   096   045   409   308      TIBC  250 - 470 ug/dL  811 (L)   914 (L)   782   327   327      %SAT  20 - 55 %  23   23   6  (L)   6 (L)   6 (L)            Range  1wk ago (01/10/11)  28mo ago (11/08/10)  28mo ago (11/08/10)     Ferritin  10 - 291 ng/mL  155   7 (L)   7 (L)     ASSESSMENT: Ms. a very pleasant 40 years old old white female with history of iron deficiency anemia status post Feraheme infusion x2 doses. The patient has significant improvement in her condition. I discussed the lab result with her today.   PLAN: I recommend for her continuous observation with repeat CBC and iron study in 3 months and the patient would come back for followup visit at that time.   All  questions were answered. The patient knows to call the clinic with any problems, questions or concerns. We can certainly see the patient much sooner if necessary.

## 2011-04-17 ENCOUNTER — Ambulatory Visit (HOSPITAL_BASED_OUTPATIENT_CLINIC_OR_DEPARTMENT_OTHER): Payer: PRIVATE HEALTH INSURANCE | Admitting: Internal Medicine

## 2011-04-17 ENCOUNTER — Ambulatory Visit: Payer: PRIVATE HEALTH INSURANCE | Admitting: Internal Medicine

## 2011-04-17 ENCOUNTER — Other Ambulatory Visit (HOSPITAL_BASED_OUTPATIENT_CLINIC_OR_DEPARTMENT_OTHER): Payer: PRIVATE HEALTH INSURANCE | Admitting: Lab

## 2011-04-17 ENCOUNTER — Telehealth: Payer: Self-pay | Admitting: Internal Medicine

## 2011-04-17 VITALS — BP 114/92 | HR 89 | Temp 98.0°F | Wt 223.1 lb

## 2011-04-17 DIAGNOSIS — D509 Iron deficiency anemia, unspecified: Secondary | ICD-10-CM

## 2011-04-17 DIAGNOSIS — Z862 Personal history of diseases of the blood and blood-forming organs and certain disorders involving the immune mechanism: Secondary | ICD-10-CM

## 2011-04-17 LAB — CBC WITH DIFFERENTIAL/PLATELET
BASO%: 0.1 % (ref 0.0–2.0)
Eosinophils Absolute: 0.2 10*3/uL (ref 0.0–0.5)
MCHC: 33.4 g/dL (ref 31.5–36.0)
MONO#: 0.6 10*3/uL (ref 0.1–0.9)
NEUT#: 6.2 10*3/uL (ref 1.5–6.5)
Platelets: 306 10*3/uL (ref 145–400)
RBC: 4.3 10*6/uL (ref 3.70–5.45)
RDW: 13.8 % (ref 11.2–14.5)
WBC: 9.7 10*3/uL (ref 3.9–10.3)
lymph#: 2.7 10*3/uL (ref 0.9–3.3)

## 2011-04-17 LAB — IRON AND TIBC
%SAT: 22 % (ref 20–55)
Iron: 62 ug/dL (ref 42–145)
UIBC: 221 ug/dL (ref 125–400)

## 2011-04-17 LAB — FERRITIN: Ferritin: 145 ng/mL (ref 10–291)

## 2011-04-17 NOTE — Telephone Encounter (Signed)
gve the pt her July 2013 appt calendar °

## 2011-04-17 NOTE — Progress Notes (Signed)
[facility] [phone]   Fax:(336) [phone]  OFFICE PROGRESS NOTE  Hadley Pen, MD, MD [address] [place] [address]  DIAGNOSIS: Iron deficiency anemia   PRIOR THERAPY: Feraheme infusion for 2 doses, last dose was given 11/25/2010   CURRENT THERAPY: Observation.   INTERVAL HISTORY: Natasha [name] 41 y.o. female returns to the clinic today for routine three-month followup visit. The patient has no significant fatigue weakness. No dizzy spells. She has no chest pain or shortness of breath. No weight loss or night sweats. She has repeat CBC on a study performed earlier today and she is here for evaluation and discussion of her lab results.  MEDICAL HISTORY: Past Medical History  Diagnosis Date  . Hypertension   . GERD (gastroesophageal reflux disease)   . Asthma   . Anxiety   . Depression   . Degenerative joint disease   . Migraine     ALLERGIES:  is allergic to dipyridamole; inderal; and midodrine.  MEDICATIONS:  Current Outpatient Prescriptions  Medication Sig Dispense Refill  . montelukast (SINGULAIR) 10 MG tablet Take 10 mg by mouth at bedtime.      . ALBUTEROL IN Inhale into the lungs daily as needed. 2 puffs daily prn       . buPROPion (WELLBUTRIN XL) 150 MG 24 hr tablet Take 150 mg by mouth daily.        . clonazePAM (KLONOPIN) 0.5 MG tablet Take 0.5 mg by mouth daily.        . RABEprazole (ACIPHEX) 20 MG tablet Take 20 mg by mouth daily.        . Topiramate (TOPAMAX PO) Take 150 mg by mouth daily.          SURGICAL HISTORY:  Past Surgical History  Procedure Date  . Breast reduction surgery 2001    REVIEW OF SYSTEMS:  A comprehensive review of systems was negative.   PHYSICAL EXAMINATION: General appearance: alert, cooperative and no distress Neck: no adenopathy Lymph nodes: Cervical, supraclavicular, and axillary nodes normal. Resp: clear to auscultation bilaterally Cardio: regular rate and rhythm, S1, S2  normal, no murmur, click, rub or gallop GI: soft, non-tender; bowel sounds normal; no masses,  no organomegaly Extremities: extremities normal, atraumatic, no cyanosis or edema  ECOG PERFORMANCE STATUS: 0 - Asymptomatic  Blood pressure 114/92, pulse 89, temperature 98 F (36.7 C), temperature source Oral, weight 223 lb 1.6 oz (101.197 kg).  LABORATORY DATA: Lab Results  Component Value Date   WBC 6.9 01/10/2011   HGB 13.5 01/10/2011   HCT 40.6 01/10/2011   MCV 92.4 01/10/2011   PLT 259 01/10/2011      Chemistry      Component Value Date/Time   NA 141 11/08/2010 1337   NA 141 11/08/2010 1337   NA 141 11/08/2010 1337   K 3.6 11/08/2010 1337   K 3.6 11/08/2010 1337   K 3.6 11/08/2010 1337   CL 110 11/08/2010 1337   CL 110 11/08/2010 1337   CL 110 11/08/2010 1337   CO2 22 11/08/2010 1337   CO2 22 11/08/2010 1337   CO2 22 11/08/2010 1337   BUN 13 11/08/2010 1337   BUN 13 11/08/2010 1337   BUN 13 11/08/2010 1337   CREATININE 0.81 11/08/2010 1337   CREATININE 0.81 11/08/2010 1337   CREATININE 0.81 11/08/2010 1337      Component Value Date/Time   CALCIUM 8.5 11/08/2010 1337   CALCIUM 8.5 11/08/2010 1337   CALCIUM  8.5 11/08/2010 1337   ALKPHOS 139* 11/08/2010 1337   ALKPHOS 139* 11/08/2010 1337   ALKPHOS 139* 11/08/2010 1337   AST 12 11/08/2010 1337   AST 12 11/08/2010 1337   AST 12 11/08/2010 1337   ALT 10 11/08/2010 1337   ALT 10 11/08/2010 1337   ALT 10 11/08/2010 1337   BILITOT 0.3 11/08/2010 1337   BILITOT 0.3 11/08/2010 1337   BILITOT 0.3 11/08/2010 1337       RADIOGRAPHIC STUDIES: No results found.  ASSESSMENT: This is a pleasant 41 years old white female with history of iron deficiency anemia status post Feraheme infusion in October of 2012. The patient is doing fine with no evidence for anemia. Her iron studies is still pending.  PLAN: I recommended for her continuous observation for now. I would see her back for followup visit in 4 months with repeat CBC and iron study. The patient  was advised to call me immediately if she has any concerning symptoms in the interval.  All questions were answered. The patient knows to call the clinic with any problems, questions or concerns. We can certainly see the patient much sooner if necessary.

## 2011-04-18 ENCOUNTER — Encounter: Payer: Self-pay | Admitting: *Deleted

## 2011-04-18 NOTE — Progress Notes (Signed)
Per pt request, most recent labwork faxed to [phone].  SLJ

## 2011-06-19 ENCOUNTER — Other Ambulatory Visit: Payer: Self-pay | Admitting: Gynecology

## 2011-06-19 DIAGNOSIS — N6324 Unspecified lump in the left breast, lower inner quadrant: Secondary | ICD-10-CM

## 2011-06-19 DIAGNOSIS — N611 Abscess of the breast and nipple: Secondary | ICD-10-CM

## 2011-06-22 ENCOUNTER — Ambulatory Visit
Admission: RE | Admit: 2011-06-22 | Discharge: 2011-06-22 | Disposition: A | Discharge status: Home / Self Care | Payer: PRIVATE HEALTH INSURANCE | Source: Ambulatory Visit | Attending: Gynecology | Admitting: Gynecology

## 2011-06-22 DIAGNOSIS — N611 Abscess of the breast and nipple: Secondary | ICD-10-CM

## 2011-06-22 DIAGNOSIS — N6324 Unspecified lump in the left breast, lower inner quadrant: Secondary | ICD-10-CM

## 2011-06-30 ENCOUNTER — Encounter: Payer: Self-pay | Admitting: *Deleted

## 2011-08-16 ENCOUNTER — Other Ambulatory Visit (HOSPITAL_BASED_OUTPATIENT_CLINIC_OR_DEPARTMENT_OTHER): Payer: PRIVATE HEALTH INSURANCE | Admitting: Lab

## 2011-08-16 ENCOUNTER — Ambulatory Visit (HOSPITAL_BASED_OUTPATIENT_CLINIC_OR_DEPARTMENT_OTHER): Payer: PRIVATE HEALTH INSURANCE | Admitting: Internal Medicine

## 2011-08-16 ENCOUNTER — Telehealth: Payer: Self-pay | Admitting: Internal Medicine

## 2011-08-16 VITALS — BP 128/87 | HR 83 | Temp 97.7°F | Ht 65.5 in | Wt 218.1 lb

## 2011-08-16 DIAGNOSIS — D509 Iron deficiency anemia, unspecified: Secondary | ICD-10-CM

## 2011-08-16 LAB — IRON AND TIBC
TIBC: 226 ug/dL — ABNORMAL LOW (ref 250–470)
UIBC: 172 ug/dL (ref 125–400)

## 2011-08-16 LAB — CBC WITH DIFFERENTIAL/PLATELET
Basophils Absolute: 0 10*3/uL (ref 0.0–0.1)
HCT: 40.8 % (ref 34.8–46.6)
HGB: 13.9 g/dL (ref 11.6–15.9)
MONO#: 0.6 10*3/uL (ref 0.1–0.9)
NEUT%: 47 % (ref 38.4–76.8)
WBC: 7.6 10*3/uL (ref 3.9–10.3)
lymph#: 3 10*3/uL (ref 0.9–3.3)

## 2011-08-16 NOTE — Progress Notes (Signed)
[facility] Telephone:(336) [phone]   Fax:([phone]  OFFICE PROGRESS NOTE  Hadley Pen, MD [address] [place] [address]  DIAGNOSIS: Iron deficiency anemia   PRIOR THERAPY: Feraheme infusion for 2 doses, last dose was given 11/25/2010   CURRENT THERAPY: Observation.  INTERVAL HISTORY: Natasha [name] 41 y.o. female returns to the clinic today for 4 month followup visit. The patient started complaining of fatigue but mostly related to lack of sleep. She denied having any significant chest pain or shortness breath. No dizzy spells. She has no weight loss or night sweats. She has repeat CBC and iron study performed earlier today and she is here for evaluation and discussion of her lab results.  MEDICAL HISTORY: Past Medical History  Diagnosis Date  . Hypertension   . GERD (gastroesophageal reflux disease)   . Asthma   . Anxiety   . Depression   . Degenerative joint disease   . Migraine   . Obesity     ALLERGIES:  is allergic to dipyridamole; inderal; and midodrine.  MEDICATIONS:  Current Outpatient Prescriptions  Medication Sig Dispense Refill  . ALBUTEROL IN Inhale into the lungs daily as needed. 2 puffs daily prn       . buPROPion (WELLBUTRIN XL) 150 MG 24 hr tablet Take 150 mg by mouth daily.        . clonazePAM (KLONOPIN) 0.5 MG tablet Take 0.5 mg by mouth daily.        . montelukast (SINGULAIR) 10 MG tablet Take 10 mg by mouth at bedtime.      . RABEprazole (ACIPHEX) 20 MG tablet Take 20 mg by mouth daily.        . Topiramate (TOPAMAX PO) Take 150 mg by mouth daily.          SURGICAL HISTORY:  Past Surgical History  Procedure Date  . Breast reduction surgery 2001    REVIEW OF SYSTEMS:  A comprehensive review of systems was negative except for: Constitutional: positive for fatigue   PHYSICAL EXAMINATION: General appearance: alert, cooperative and no distress Neck: no adenopathy Lymph nodes: Cervical, supraclavicular, and  axillary nodes normal. Resp: clear to auscultation bilaterally Cardio: regular rate and rhythm, S1, S2 normal, no murmur, click, rub or gallop GI: soft, non-tender; bowel sounds normal; no masses,  no organomegaly Extremities: extremities normal, atraumatic, no cyanosis or edema  ECOG PERFORMANCE STATUS: 1 - Symptomatic but completely ambulatory  Blood pressure 128/87, pulse 83, temperature 97.7 F (36.5 C), temperature source Oral, height 5' 5.5" (1.664 m), weight 218 lb 1.6 oz (98.93 kg).  LABORATORY DATA: Lab Results  Component Value Date   WBC 7.6 08/16/2011   HGB 13.9 08/16/2011   HCT 40.8 08/16/2011   MCV 97.4 08/16/2011   PLT 275 08/16/2011      Chemistry      Component Value Date/Time   NA 141 11/08/2010 1337   NA 141 11/08/2010 1337   NA 141 11/08/2010 1337   K 3.6 11/08/2010 1337   K 3.6 11/08/2010 1337   K 3.6 11/08/2010 1337   CL 110 11/08/2010 1337   CL 110 11/08/2010 1337   CL 110 11/08/2010 1337   CO2 22 11/08/2010 1337   CO2 22 11/08/2010 1337   CO2 22 11/08/2010 1337   BUN 13 11/08/2010 1337   BUN 13 11/08/2010 1337   BUN 13 11/08/2010 1337   CREATININE 0.81 11/08/2010 1337   CREATININE 0.81 11/08/2010 1337   CREATININE 0.81  11/08/2010 1337      Component Value Date/Time   CALCIUM 8.5 11/08/2010 1337   CALCIUM 8.5 11/08/2010 1337   CALCIUM 8.5 11/08/2010 1337   ALKPHOS 139* 11/08/2010 1337   ALKPHOS 139* 11/08/2010 1337   ALKPHOS 139* 11/08/2010 1337   AST 12 11/08/2010 1337   AST 12 11/08/2010 1337   AST 12 11/08/2010 1337   ALT 10 11/08/2010 1337   ALT 10 11/08/2010 1337   ALT 10 11/08/2010 1337   BILITOT 0.3 11/08/2010 1337   BILITOT 0.3 11/08/2010 1337   BILITOT 0.3 11/08/2010 1337       RADIOGRAPHIC STUDIES: No results found.  ASSESSMENT: This is a very pleasant 41 years old white female with iron deficiency anemia currently on observation. Her hemoglobin and hematocrit are stable. Iron study and ferritin are still pending.  PLAN: I recommended for the patient to  continue on observation for now. I would see her back for followup visit in 6 months with repeat CBC, iron study and ferritin.  The patient was advised to call me immediately if she has any concerning symptoms in the interval.  All questions were answered. The patient knows to call the clinic with any problems, questions or concerns. We can certainly see the patient much sooner if necessary.

## 2011-08-16 NOTE — Telephone Encounter (Signed)
pts appts made and printed for pt aom

## 2011-08-23 ENCOUNTER — Telehealth: Payer: Self-pay | Admitting: Medical Oncology

## 2011-08-23 NOTE — Telephone Encounter (Signed)
Please mail copy of last labs -done

## 2011-08-24 ENCOUNTER — Telehealth: Payer: Self-pay | Admitting: Medical Oncology

## 2011-08-24 NOTE — Telephone Encounter (Signed)
Called lab results from 7/17  to pt per her request .

## 2011-09-05 ENCOUNTER — Other Ambulatory Visit: Payer: Self-pay | Admitting: Unknown Physician Specialty

## 2011-09-05 LAB — CLOSTRIDIUM DIFFICILE BY PCR

## 2011-09-08 ENCOUNTER — Ambulatory Visit: Payer: Self-pay | Admitting: Unknown Physician Specialty

## 2011-09-08 LAB — BASIC METABOLIC PANEL
Calcium, Total: 8.8 mg/dL (ref 8.5–10.1)
Chloride: 111 mmol/L — ABNORMAL HIGH (ref 98–107)
Co2: 24 mmol/L (ref 21–32)
Glucose: 90 mg/dL (ref 65–99)
Potassium: 2.8 mmol/L — ABNORMAL LOW (ref 3.5–5.1)

## 2011-09-13 LAB — PATHOLOGY REPORT

## 2012-02-01 ENCOUNTER — Telehealth (INDEPENDENT_AMBULATORY_CARE_PROVIDER_SITE_OTHER): Payer: Self-pay | Admitting: General Surgery

## 2012-02-01 NOTE — Telephone Encounter (Signed)
12/22/11 mailed recall letter for bariatric surgery follow-up to pt. Advised pt to call [facility] at [phone] to °schedule appt. (lss) ° °

## 2012-02-07 ENCOUNTER — Telehealth: Payer: Self-pay | Admitting: Internal Medicine

## 2012-02-07 NOTE — Telephone Encounter (Signed)
Pt called and r/s appt from 1/14 to 03/05/12 lab and MD , nurse notified

## 2012-02-09 ENCOUNTER — Other Ambulatory Visit: Payer: PRIVATE HEALTH INSURANCE | Admitting: Lab

## 2012-02-13 ENCOUNTER — Ambulatory Visit: Payer: PRIVATE HEALTH INSURANCE | Admitting: Internal Medicine

## 2012-03-22 ENCOUNTER — Other Ambulatory Visit (HOSPITAL_BASED_OUTPATIENT_CLINIC_OR_DEPARTMENT_OTHER): Payer: BC Managed Care – PPO | Admitting: Lab

## 2012-03-22 ENCOUNTER — Other Ambulatory Visit: Payer: Self-pay | Admitting: Medical Oncology

## 2012-03-22 LAB — CBC WITH DIFFERENTIAL/PLATELET
Eosinophils Absolute: 0.3 10*3/uL (ref 0.0–0.5)
MONO#: 0.6 10*3/uL (ref 0.1–0.9)
MONO%: 6.8 % (ref 0.0–14.0)
NEUT#: 5 10*3/uL (ref 1.5–6.5)
RBC: 4.03 10*6/uL (ref 3.70–5.45)
RDW: 13.6 % (ref 11.2–14.5)
WBC: 9.6 10*3/uL (ref 3.9–10.3)

## 2012-03-23 LAB — IRON AND TIBC
%SAT: 22 % (ref 20–55)
Iron: 57 ug/dL (ref 42–145)
UIBC: 205 ug/dL (ref 125–400)

## 2012-03-23 LAB — FERRITIN: Ferritin: 51 ng/mL (ref 10–291)

## 2012-03-25 ENCOUNTER — Encounter: Payer: Self-pay | Admitting: Internal Medicine

## 2012-03-25 ENCOUNTER — Ambulatory Visit (HOSPITAL_BASED_OUTPATIENT_CLINIC_OR_DEPARTMENT_OTHER): Payer: BC Managed Care – PPO | Admitting: Internal Medicine

## 2012-03-25 ENCOUNTER — Telehealth: Payer: Self-pay | Admitting: Internal Medicine

## 2012-03-25 ENCOUNTER — Encounter: Payer: Self-pay | Admitting: *Deleted

## 2012-03-25 DIAGNOSIS — D509 Iron deficiency anemia, unspecified: Secondary | ICD-10-CM

## 2012-03-25 NOTE — Patient Instructions (Addendum)
Blood work is normal today. Followup in 6 months with repeat CBC and iron study.

## 2012-03-25 NOTE — Telephone Encounter (Signed)
gv and printed appt schedule to pt for Aug

## 2012-03-25 NOTE — Progress Notes (Signed)
[facility] Telephone:(336) [phone]   Fax:([phone]  OFFICE PROGRESS NOTE  Hadley Pen, MD [address] [place] [address]  DIAGNOSIS: Iron deficiency anemia   PRIOR THERAPY: Feraheme infusion for 2 doses, last dose was given 11/25/2010   CURRENT THERAPY: Observation.  INTERVAL HISTORY: Natasha [name] 42 y.o. female returns to the clinic today for routine six-month followup visit. The patient is feeling fine today with no specific complaints except for mild fatigue most likely from or can taking care of her kids. She denied having any significant chest pain or shortness breath, no cough or hemoptysis. She denied having any significant weight loss or night sweats. TSH was evaluated at her work and post normal. The patient had repeat CBC and iron study performed recently and she is here for evaluation and discussion of her lab results.  MEDICAL HISTORY: Past Medical History  Diagnosis Date  . Hypertension   . GERD (gastroesophageal reflux disease)   . Asthma   . Anxiety   . Depression   . Degenerative joint disease   . Migraine   . Obesity     ALLERGIES:  is allergic to dipyridamole; inderal; and midodrine.  MEDICATIONS:  Current Outpatient Prescriptions  Medication Sig Dispense Refill  . ALBUTEROL IN Inhale into the lungs daily as needed. 2 puffs daily prn       . budesonide (ENTOCORT EC) 3 MG 24 hr capsule Take 3 mg by mouth daily.      . citalopram (CELEXA) 40 MG tablet Take 40 mg by mouth daily.      . clonazePAM (KLONOPIN) 0.5 MG tablet Take 0.5 mg by mouth daily.        . diclofenac (VOLTAREN) 75 MG EC tablet as needed.      . montelukast (SINGULAIR) 10 MG tablet Take 10 mg by mouth at bedtime.      . RABEprazole (ACIPHEX) 20 MG tablet Take 40 mg by mouth daily.       . Topiramate (TOPAMAX PO) Take 200 mg by mouth daily.        No current facility-administered medications for this visit.    SURGICAL HISTORY:  Past Surgical  History  Procedure Laterality Date  . Breast reduction surgery  2001    REVIEW OF SYSTEMS:  A comprehensive review of systems was negative except for: Constitutional: positive for fatigue   PHYSICAL EXAMINATION: General appearance: alert, cooperative and no distress Head: Normocephalic, without obvious abnormality, atraumatic Neck: no adenopathy Resp: clear to auscultation bilaterally Cardio: regular rate and rhythm, S1, S2 normal, no murmur, click, rub or gallop GI: soft, non-tender; bowel sounds normal; no masses,  no organomegaly Extremities: extremities normal, atraumatic, no cyanosis or edema  ECOG PERFORMANCE STATUS: 1 - Symptomatic but completely ambulatory  There were no vitals taken for this visit.  LABORATORY DATA: Lab Results  Component Value Date   WBC 9.6 03/22/2012   HGB 13.7 03/22/2012   HCT 39.9 03/22/2012   MCV 99.2 03/22/2012   PLT 238 03/22/2012      Chemistry      Component Value Date/Time   NA 141 11/08/2010 1337   K 3.6 11/08/2010 1337   CL 110 11/08/2010 1337   CO2 22 11/08/2010 1337   BUN 13 11/08/2010 1337   CREATININE 0.81 11/08/2010 1337      Component Value Date/Time   CALCIUM 8.5 11/08/2010 1337   ALKPHOS 139* 11/08/2010 1337   AST 12 11/08/2010 1337  ALT 10 11/08/2010 1337   BILITOT 0.3 11/08/2010 1337       RADIOGRAPHIC STUDIES: No results found.  ASSESSMENT: This is a very pleasant 41 years old white female with history of iron deficiency anemia status post Feraheme infusion in October of 2012 and she has been observation since that time was no evidence for worsening anemia. Her CBC and anemia panel today is normal.  PLAN: I discussed the lab result with the patient today. I recommended for her to continue on observation with repeat CBC and iron study in 6 months.  She was advised to call me immediately if she has any concerning symptoms in the interval.  All questions were answered. The patient knows to call the clinic with any problems,  questions or concerns. We can certainly see the patient much sooner if necessary.

## 2012-05-02 ENCOUNTER — Observation Stay (HOSPITAL_COMMUNITY)
Admission: EM | Admit: 2012-05-02 | Discharge: 2012-05-03 | Disposition: A | Discharge status: Home / Self Care | Payer: BC Managed Care – PPO | Attending: Internal Medicine | Admitting: Internal Medicine

## 2012-05-02 ENCOUNTER — Encounter (HOSPITAL_COMMUNITY): Payer: Self-pay | Admitting: Emergency Medicine

## 2012-05-02 ENCOUNTER — Emergency Department (HOSPITAL_COMMUNITY): Payer: BC Managed Care – PPO

## 2012-05-02 DIAGNOSIS — K219 Gastro-esophageal reflux disease without esophagitis: Secondary | ICD-10-CM

## 2012-05-02 DIAGNOSIS — F419 Anxiety disorder, unspecified: Secondary | ICD-10-CM

## 2012-05-02 DIAGNOSIS — J45909 Unspecified asthma, uncomplicated: Secondary | ICD-10-CM

## 2012-05-02 DIAGNOSIS — D509 Iron deficiency anemia, unspecified: Secondary | ICD-10-CM | POA: Insufficient documentation

## 2012-05-02 DIAGNOSIS — R079 Chest pain, unspecified: Principal | ICD-10-CM

## 2012-05-02 DIAGNOSIS — F411 Generalized anxiety disorder: Secondary | ICD-10-CM

## 2012-05-02 DIAGNOSIS — J45901 Unspecified asthma with (acute) exacerbation: Secondary | ICD-10-CM

## 2012-05-02 DIAGNOSIS — I10 Essential (primary) hypertension: Secondary | ICD-10-CM

## 2012-05-02 DIAGNOSIS — Z9884 Bariatric surgery status: Secondary | ICD-10-CM | POA: Insufficient documentation

## 2012-05-02 DIAGNOSIS — R110 Nausea: Secondary | ICD-10-CM | POA: Insufficient documentation

## 2012-05-02 DIAGNOSIS — R61 Generalized hyperhidrosis: Secondary | ICD-10-CM | POA: Insufficient documentation

## 2012-05-02 HISTORY — DX: Other specified postprocedural states: Z98.890

## 2012-05-02 HISTORY — DX: Chest pain, unspecified: R07.9

## 2012-05-02 HISTORY — DX: Nausea with vomiting, unspecified: R11.2

## 2012-05-02 HISTORY — DX: Iron deficiency anemia, unspecified: D50.9

## 2012-05-02 HISTORY — DX: Calculus of kidney: N20.0

## 2012-05-02 HISTORY — DX: Pneumonia, unspecified organism: J18.9

## 2012-05-02 LAB — TROPONIN I: Troponin I: <0.3 ng/mL (ref <0.30)

## 2012-05-02 LAB — BASIC METABOLIC PANEL
BUN: 18 mg/dL (ref 6–23)
Calcium: 8.6 mg/dL (ref 8.4–10.5)
Creatinine, Ser: 0.77 mg/dL (ref 0.50–1.10)
GFR calc Af Amer: >90 mL/min (ref >90)
GFR calc non Af Amer: >90 mL/min (ref >90)
Potassium: 3.8 mEq/L (ref 3.5–5.1)

## 2012-05-02 LAB — TSH: TSH: 1.513 u[IU]/mL (ref 0.350–4.500)

## 2012-05-02 LAB — CBC
HCT: 39.2 % (ref 36.0–46.0)
MCH: 33.2 pg (ref 26.0–34.0)
MCHC: 33.9 g/dL (ref 30.0–36.0)
RDW: 12.9 % (ref 11.5–15.5)

## 2012-05-02 LAB — POCT I-STAT TROPONIN I: Troponin i, poc: 0 ng/mL (ref 0.00–0.08)

## 2012-05-02 MED ORDER — PANTOPRAZOLE SODIUM 40 MG PO TBEC
40.0000 mg | DELAYED_RELEASE_TABLET | Freq: Every day | ORAL | Status: DC
  Administered 2012-05-02 – 2012-05-03 (×2): 40 mg via ORAL
  Filled 2012-05-02: qty 1

## 2012-05-02 MED ORDER — CLONAZEPAM 0.5 MG PO TABS: 0.5000 mg | ORAL_TABLET | Freq: Two times a day (BID) | ORAL | Status: DC | PRN

## 2012-05-02 MED ORDER — CITALOPRAM HYDROBROMIDE 40 MG PO TABS
40.0000 mg | ORAL_TABLET | Freq: Every day | ORAL | Status: DC
Start: 2012-05-02 — End: 2012-05-03
  Administered 2012-05-02 – 2012-05-03 (×2): 40 mg via ORAL
  Filled 2012-05-02 (×2): qty 1

## 2012-05-02 MED ORDER — SODIUM CHLORIDE 0.9 % IJ SOLN
3.0000 mL | Freq: Two times a day (BID) | INTRAMUSCULAR | Status: DC
  Administered 2012-05-02: 3 mL via INTRAVENOUS

## 2012-05-02 MED ORDER — ONDANSETRON HCL 4 MG/2ML IJ SOLN
INTRAMUSCULAR | Status: AC
  Filled 2012-05-02: qty 2

## 2012-05-02 MED ORDER — ONDANSETRON HCL 4 MG/2ML IJ SOLN
4.0000 mg | Freq: Once | INTRAMUSCULAR | Status: AC
  Administered 2012-05-02: 4 mg via INTRAVENOUS
  Filled 2012-05-02: qty 2

## 2012-05-02 MED ORDER — ALBUTEROL SULFATE HFA 108 (90 BASE) MCG/ACT IN AERS
2.0000 | INHALATION_SPRAY | Freq: Four times a day (QID) | RESPIRATORY_TRACT | Status: DC | PRN
  Filled 2012-05-02: qty 6.7

## 2012-05-02 MED ORDER — ASPIRIN 81 MG PO CHEW
324.0000 mg | CHEWABLE_TABLET | ORAL | Status: AC
  Administered 2012-05-02: 324 mg via ORAL
  Filled 2012-05-02: qty 4

## 2012-05-02 MED ORDER — SODIUM CHLORIDE 0.9 % IV SOLN: 250.0000 mL | INTRAVENOUS | Status: DC | PRN

## 2012-05-02 MED ORDER — ONDANSETRON HCL 4 MG/2ML IJ SOLN
4.0000 mg | Freq: Once | INTRAMUSCULAR | Status: AC
  Administered 2012-05-02: 4 mg via INTRAVENOUS

## 2012-05-02 MED ORDER — TOPIRAMATE 100 MG PO TABS
300.0000 mg | ORAL_TABLET | Freq: Two times a day (BID) | ORAL | Status: DC
  Administered 2012-05-02 – 2012-05-03 (×2): 300 mg via ORAL
  Filled 2012-05-02 (×3): qty 3

## 2012-05-02 MED ORDER — NITROGLYCERIN 0.4 MG SL SUBL
0.4000 mg | SUBLINGUAL_TABLET | Freq: Once | SUBLINGUAL | Status: AC
  Administered 2012-05-02: 0.4 mg via SUBLINGUAL
  Filled 2012-05-02: qty 25

## 2012-05-02 MED ORDER — NITROGLYCERIN 0.4 MG SL SUBL
0.4000 mg | SUBLINGUAL_TABLET | SUBLINGUAL | Status: DC | PRN
  Administered 2012-05-02 (×2): 0.4 mg via SUBLINGUAL

## 2012-05-02 MED ORDER — ASPIRIN 300 MG RE SUPP
300.0000 mg | RECTAL | Status: AC
  Filled 2012-05-02: qty 1

## 2012-05-02 MED ORDER — SODIUM CHLORIDE 0.9 % IJ SOLN: 3.0000 mL | INTRAMUSCULAR | Status: DC | PRN

## 2012-05-02 MED ORDER — BUDESONIDE 3 MG PO CP24
3.0000 mg | ORAL_CAPSULE | Freq: Every day | ORAL | Status: DC
  Administered 2012-05-02: 3 mg via ORAL
  Filled 2012-05-02 (×2): qty 1

## 2012-05-02 MED ORDER — MONTELUKAST SODIUM 10 MG PO TABS
10.0000 mg | ORAL_TABLET | Freq: Every day | ORAL | Status: DC
  Administered 2012-05-02: 10 mg via ORAL
  Filled 2012-05-02 (×2): qty 1

## 2012-05-02 MED ORDER — ASPIRIN EC 81 MG PO TBEC
81.0000 mg | DELAYED_RELEASE_TABLET | Freq: Every day | ORAL | Status: DC
  Administered 2012-05-03: 81 mg via ORAL
  Filled 2012-05-02: qty 1

## 2012-05-02 MED ORDER — MORPHINE SULFATE 2 MG/ML IJ SOLN: 1.0000 mg | INTRAMUSCULAR | Status: DC | PRN

## 2012-05-02 MED ORDER — ENOXAPARIN SODIUM 40 MG/0.4ML ~~LOC~~ SOLN
40.0000 mg | SUBCUTANEOUS | Status: DC
  Administered 2012-05-02: 40 mg via SUBCUTANEOUS
  Filled 2012-05-02 (×2): qty 0.4

## 2012-05-02 NOTE — ED Notes (Signed)
Medication had to be overrode in pyxis due to work on all pyxis.

## 2012-05-02 NOTE — ED Notes (Signed)
Called flow manager to ensure bed request would be placed for patient

## 2012-05-02 NOTE — ED Provider Notes (Signed)
History     CSN: [id number]  Arrival date & time 05/02/12  0848   First MD Initiated Contact with Patient 05/02/12 0901      Chief Complaint  Patient presents with  . Chest Pain    (Consider location/radiation/quality/duration/timing/severity/associated sxs/prior treatment) Patient is a 42 y.o. female presenting with chest pain. The history is provided by the patient.  Chest Pain Associated symptoms: diaphoresis and nausea   Associated symptoms: no abdominal pain, no back pain, no headache, no numbness, no shortness of breath, not vomiting and no weakness    patient was driving to work today where she began to have the feeling of health and on her chest. She also said she had some nausea and was sweaty. She had no real shortness of breath with it. She went to work and said that she continue to have symptoms. She does not have chest pain. She's had a previous lap band. She does not smoke. No fevers. She states she still has some pressure, however is improved. No abdominal pain. No vomiting or diarrhea. She states she felt fine yesterday.  Past Medical History  Diagnosis Date  . Hypertension   . GERD (gastroesophageal reflux disease)   . Asthma   . Anxiety   . Depression   . Obesity   . PONV (postoperative nausea and vomiting)   . Chest pain on exertion 08/2010; 05/02/2012  . Pneumonia 2000's  . Iron deficiency anemia   . Migraine   . Degenerative joint disease     "lower back" (05/02/2012)  . Kidney stones     "never had OR" (05/02/2012)    Past Surgical History  Procedure Laterality Date  . Breast reduction surgery Bilateral 2001  . Laparoscopic gastric banding  2009  . Cesarean section  2004  . Lumbar microdiscectomy  2006  . Cholecystectomy  2008  . Iud removal  2010    Family History  Problem Relation Age of Onset  . Lupus Mother   . Heart disease Father     History  Substance Use Topics  . Smoking status: Never Smoker   . Smokeless tobacco: Never Used  .  Alcohol Use: Yes     Comment: 05/02/2012 "mixed drink maybe q 6 months"    OB History   Grav Para Term Preterm Abortions TAB SAB Ect Mult Living                  Review of Systems  Constitutional: Positive for diaphoresis. Negative for activity change and appetite change.  HENT: Negative for neck stiffness.   Eyes: Negative for pain.  Respiratory: Negative for chest tightness and shortness of breath.   Cardiovascular: Positive for chest pain. Negative for leg swelling.  Gastrointestinal: Positive for nausea. Negative for vomiting, abdominal pain and diarrhea.  Genitourinary: Negative for flank pain.  Musculoskeletal: Negative for back pain.  Skin: Negative for rash.  Neurological: Negative for weakness, numbness and headaches.  Psychiatric/Behavioral: Negative for behavioral problems.    Allergies  Dipyridamole; Inderal; Midodrine; and Shellfish allergy  Home Medications   No current outpatient prescriptions on file.  BP 124/73  Pulse 76  Temp(Src) 97.7 F (36.5 C) (Oral)  Resp 20  Ht 5\' 5"  (1.651 m)  Wt 220 lb (99.791 kg)  BMI 36.61 kg/m2  SpO2 97%  Physical Exam  Nursing note and vitals reviewed. Constitutional: She is oriented to person, place, and time. She appears well-developed and well-nourished.  Patient is obese  HENT:  Head: Normocephalic and  atraumatic.  Eyes: EOM are normal. Pupils are equal, round, and reactive to light.  Neck: Normal range of motion. Neck supple.  Cardiovascular: Normal rate, regular rhythm and normal heart sounds.   No murmur heard. Pulmonary/Chest: Effort normal and breath sounds normal. No respiratory distress. She has no wheezes. She has no rales.  Abdominal: Soft. Bowel sounds are normal. She exhibits no distension. There is no tenderness. There is no rebound and no guarding.  Musculoskeletal: Normal range of motion. She exhibits edema.  Mild bilateral lower extremity pitting edema.  Neurological: She is alert and oriented to  person, place, and time. No cranial nerve deficit.  Skin: Skin is warm and dry.  Psychiatric: She has a normal mood and affect. Her speech is normal.    ED Course  Procedures (including critical care time)  Labs Reviewed  BASIC METABOLIC PANEL - Abnormal; Notable for the following:    Calcium 8.3 (*)    GFR calc non Af Amer 84 (*)    All other components within normal limits  MRSA PCR SCREENING  CBC  BASIC METABOLIC PANEL  TROPONIN I  TROPONIN I  TSH  CBC  URINE RAPID DRUG SCREEN (HOSP PERFORMED)  POCT I-STAT TROPONIN I  POCT I-STAT TROPONIN I   Dg Chest 2 View  05/02/2012  *RADIOLOGY REPORT*  Clinical Data: Chest pain  CHEST - 2 VIEW  Comparison: September 02, 2009  Findings: Lungs clear.  Heart size and pulmonary vascularity are normal.  No adenopathy.  No pneumothorax.  No bone lesions.  IMPRESSION: No abnormality noted.   Original Report Authenticated By: Bretta Bang, M.D.      1. Chest pain   2. Anxiety   3. Asthma   4. GERD (gastroesophageal reflux disease)      Date: 05/02/2012  Rate: 87  Rhythm: normal sinus rhythm  QRS Axis: normal  Intervals: normal  ST/T Wave abnormalities: nonspecific ST/T changes  Conduction Disutrbances:none  Narrative Interpretation:   Old EKG Reviewed: unchanged    MDM  Patient with chest pain. Also had nausea and diaphoresis. EKG and labwork is reassuring, however patient is at risk for cardiac disease or hypertension previous severe obesity. She'll be admitted to medicine for further evaluation and treatment.        Juliet Rude. Rubin Payor, MD 05/03/12 [phone]

## 2012-05-02 NOTE — H&P (Signed)
Date: 05/02/2012               Patient Name:  Natasha [name] MRN: [id number]  DOB: April 16, 1970 Age / Sex: 42 y.o., female   PCP: Hadley Pen, MD              Medical Service: Internal Medicine Teaching Service (Case was discussed with the resident Dr. Audley Hose)         Chief Complaint: Chest pain  History of Present Illness: Patient is a 42 y.o. female with a PMHx of reflux disease, asthma, anxiety and morbid obesity status post Lap banding in 2009, who presents to [facility] for evaluation of acute onset chest pain that began morning of admission while the patient was in her office. Pain is described as a heaviness chest pain located over mid chest. The pain is rated as a 9/10 in severity at onset and currently rated 5/10. The pain is notably aggravated by nothing. It is alleviated by nitroglycerin 0.4 mg tablets given to her in the ER.  denies associated diaphoresis, nausea, shortness of breath and syncope. For the pain, the patient tried nothing for pain relief.  The patient has experienced similar symptoms previously. Secondary to her constellation of symptoms, Ms. Natasha [name] presented to [facility] for further evaluation. Patient had a negative stress test when she had a similar episode previously.  Current Outpatient Medications: No current facility-administered medications for this encounter. Current outpatient prescriptions:albuterol (PROVENTIL HFA;VENTOLIN HFA) 108 (90 BASE) MCG/ACT inhaler, Inhale 2 puffs into the lungs every 6 (six) hours as needed for wheezing., Disp: , Rfl: ;  Aspirin-Acetaminophen-Caffeine (GOODY HEADACHE PO), Take 1 packet by mouth once., Disp: , Rfl: ;  budesonide (ENTOCORT EC) 3 MG 24 hr capsule, Take 3 mg by mouth daily., Disp: , Rfl:  citalopram (CELEXA) 40 MG tablet, Take 40 mg by mouth daily., Disp: , Rfl: ;  clonazePAM (KLONOPIN) 0.5 MG tablet, Take 0.5 mg by mouth 2 (two) times daily as needed for anxiety., Disp: , Rfl: ;  diclofenac (VOLTAREN) 75 MG EC tablet, Take 75 mg  by mouth 2 (two) times daily. , Disp: , Rfl: ;  ketorolac (TORADOL) 10 MG tablet, Take 10 mg by mouth every 6 (six) hours as needed for pain., Disp: , Rfl:  montelukast (SINGULAIR) 10 MG tablet, Take 10 mg by mouth at bedtime., Disp: , Rfl: ;  omeprazole (PRILOSEC) 40 MG capsule, Take 40 mg by mouth daily., Disp: , Rfl: ;  topiramate (TOPAMAX) 100 MG tablet, Take 300 mg by mouth 2 (two) times daily., Disp: , Rfl:    Allergies: Allergies  Allergen Reactions  . Dipyridamole   . Inderal (Propranolol Hcl)   . Midodrine   . Shellfish Allergy      Past Medical History: Past Medical History  Diagnosis Date  . Hypertension   . GERD (gastroesophageal reflux disease)   . Asthma   . Anxiety   . Depression   . Degenerative joint disease   . Migraine   . Obesity     Past Surgical History: Past Surgical History  Procedure Laterality Date  . Breast reduction surgery  2001  . Lab band    . Cholecystectomy    . Cesarean section    . Lumbar microdiscectomy      Family History: Family History  Problem Relation Age of Onset  . Lupus Mother   . Heart disease Father     Social History: History   Social History  . Marital Status: Single  Spouse Name: N/A    Number of Children: N/A  . Years of Education: N/A   Occupational History  . Not on file.   Social History Main Topics  . Smoking status: Never Smoker   . Smokeless tobacco: Not on file  . Alcohol Use: Yes  . Drug Use: No  . Sexually Active: Not on file   Other Topics Concern  . Not on file   Social History Narrative  . No narrative on file    Review of Systems: 15 point review of system was negative except what is noted in the history of present illness.  Vital Signs: Blood pressure 130/54, pulse 72, temperature 97.8 F (36.6 C), temperature source Oral, resp. rate 13, height 5\' 5"  (1.651 m), weight 220 lb (99.791 kg), SpO2 99.00%.  Physical Exam: General: Vital signs reviewed and noted. Well-developed,  well-nourished, in no acute distress; alert, appropriate and cooperative throughout examination.  Head: Normocephalic, atraumatic.  Eyes: PERRL, EOMI, No signs of anemia or jaundince.  Nose: Mucous membranes moist, not inflammed, nonerythematous.  Throat: Oropharynx nonerythematous, no exudate appreciated.   Neck: No deformities, masses, or tenderness noted.Supple, No carotid Bruits, no JVD.  Lungs:  Normal respiratory effort. Clear to auscultation BL without crackles or wheezes.  Heart: RRR. S1 and S2 normal without gallop, murmur, or rubs.  Abdomen:  BS normoactive. Soft, Nondistended, non-tender.  No masses or organomegaly.  Extremities: No pretibial edema.  Neurologic: A&O X3, CN II - XII are grossly intact. Motor strength is 5/5 in the all 4 extremities, Sensations intact to light touch, Cerebellar signs negative.  Skin: No visible rashes, scars.    Lab results: Basic Metabolic Panel:  Recent Labs  16/10/96 0910  NA 140  K 3.8  CL 111  CO2 23  GLUCOSE 90  BUN 18  CREATININE 0.77  CALCIUM 8.6   CBC:  Recent Labs  05/02/12 0910  WBC 7.1  HGB 13.3  HCT 39.2  MCV 97.8  PLT 248   Urinalysis: No results found for this basename: COLORURINE, APPERANCEUR, LABSPEC, PHURINE, GLUCOSEU, HGBUR, BILIRUBINUR, KETONESUR, PROTEINUR, UROBILINOGEN, NITRITE, LEUKOCYTESUR,  in the last 72 hours    Imaging results:  Dg Chest 2 View  05/02/2012  *RADIOLOGY REPORT*  Clinical Data: Chest pain  CHEST - 2 VIEW  Comparison: September 02, 2009  Findings: Lungs clear.  Heart size and pulmonary vascularity are normal.  No adenopathy.  No pneumothorax.  No bone lesions.  IMPRESSION: No abnormality noted.   Original Report Authenticated By: Bretta Bang, M.D.       Other results:  EKG (05/02/2012) - Normal Sinus Rhythm, regular rate of approximately 87 bpm, left axis, ST segments: normal and Nonspecific T wave changes noted in the lateral and anterior precordial leads.     Assessment &  Plan: Pt is a 42 y.o. yo female with a PMHX of morbid obesity status post laparoscopic gastric banding, reflux disease, anxiety, colitis, who presented to [facility] on 05/02/2012 with symptoms of mid chest chest pain, with EKG showing no changes and initial troponins of negative. Therefore, interventions at this time will be focused on risk stratification and finding the cause of chest pain.    Atypical chest pain - patient does not have known history of coronary artery disease. She also has cardiac risk factors including the following: family history of premature CAD  obese  sedentary lifestyle  [place] 10-year risk <10%. Admission EKG showed no changes, and cardiac enzymes have been negative so far which  is consistent with atypical chest pain with very low risk of acute coronary syndrome at this time. We will also strict risk factor modification and management. Most likely cause of pain at this time seems to be gastrointestinal in origin. Especially given the history of laparoscopic banding gastric erosions are very likely as late complication. Patient will need an upper endoscopy for evaluation for gastric erosion. Patient also admits to using Goody powders which increases her risk of gastric ulcers even further.  Admit to telemetry  Continue to cycle cardiac enzymes.  Continue aspirin, beta blocker, statin therapy.  Continue morphine, oxygen, PRN nitroglycerin  Check HgA1c for risk factor stratification.   Protonix twice a day   Rest of the medical management as per resident's note.   DVT PPX - low molecular weight heparin  CODE STATUS - full code  CONSULTS PLACED - none   Signed: Lars Mage, MD  Faculty-Internal Medicine Residency Program Please refer to H&P from residents for contact information. 05/02/2012, 5:01 PM

## 2012-05-02 NOTE — ED Notes (Signed)
Patient states she was driving into work today at 0700 and started having 9/10 chest pressure mid chest.   Patient states that she did have some diaphoresis and a little nausea with the pain initially, but has resolved.  Patient states that the pressure is constant and does not radiate.

## 2012-05-02 NOTE — ED Notes (Signed)
Patient states "i feel all better.  I don't have any pain and no nausea, I'm ready to leave."

## 2012-05-02 NOTE — H&P (Signed)
Medical Student Hospital Admission Note Date: 05/02/2012  Patient name: Natasha [name] Medical record number: [id number] Date of birth: 1970-10-28 Age: 42 y.o. Gender: female PCP: Hadley Pen, MD  Medical Service: Internal Medicine Teaching Service  Attending physician:  Lars Mage, MD     Chief Complaint: chest pain  History of Present Illness: Patient is a 42 yo female with a history of GERD, anxiety, asthma, migraines, previous Lap-band surgery (HTN resolved), and FH significant for heart disease, who presents to the ED with chest pain. The chest pain started early this morning and did not resolve with rest or Goody's powder, but did resolve with nitroglycerin in the ED. Patient describes the chest pain as a tightening sensation "sitting on my chest" that does not radiate. During this first episode of chest pain, patient also felt cold, clammy and dizzy while standing. While in the ED, the patient experienced a second episode of chest pain, and this pain was present >2 hrs later and continued as the team saw spoke with her. She experienced similar chest pain in 2011, and stress test performed at that time showed no evidence of reversible ischemia or scar with normal ejection fraction. Pt has GERD but states this chest pain is different from her GERD. Patient denies ever having experienced a panic attack. Patient denies fevers, chills, congestion, nausea, abdominal pain, diarrhea, dysuria, and leg pain.  Meds: Current Outpatient Rx  Name  Route  Sig  Dispense  Refill  . albuterol (PROVENTIL HFA;VENTOLIN HFA) 108 (90 BASE) MCG/ACT inhaler   Inhalation   Inhale 2 puffs into the lungs every 6 (six) hours as needed for wheezing.         . Aspirin-Acetaminophen-Caffeine (GOODY HEADACHE PO)   Oral   Take 1 packet by mouth once.         . budesonide (ENTOCORT EC) 3 MG 24 hr capsule   Oral   Take 3 mg by mouth daily.         . citalopram (CELEXA) 40 MG tablet   Oral   Take 40 mg by  mouth daily.         . clonazePAM (KLONOPIN) 0.5 MG tablet   Oral   Take 0.5 mg by mouth 2 (two) times daily as needed for anxiety.         . diclofenac (VOLTAREN) 75 MG EC tablet   Oral   Take 75 mg by mouth 2 (two) times daily.          Marland Kitchen ketorolac (TORADOL) 10 MG tablet   Oral   Take 10 mg by mouth every 6 (six) hours as needed for pain.         . montelukast (SINGULAIR) 10 MG tablet   Oral   Take 10 mg by mouth at bedtime.         Marland Kitchen omeprazole (PRILOSEC) 40 MG capsule   Oral   Take 40 mg by mouth daily.         Marland Kitchen topiramate (TOPAMAX) 100 MG tablet   Oral   Take 300 mg by mouth 2 (two) times daily.           Allergies: Allergies as of 05/02/2012 - Review Complete 05/02/2012  Allergen Reaction Noted  . Dipyridamole  01/17/2011  . Inderal (propranolol hcl)  01/17/2011  . Midodrine  01/17/2011  . Shellfish allergy  05/02/2012   Past Medical History  Diagnosis Date  . Hypertension   . GERD (gastroesophageal reflux disease)   . Asthma   .  Anxiety   . Depression   . Degenerative joint disease   . Migraine   . Obesity    Past Surgical History  Procedure Laterality Date  . Breast reduction surgery  2001  . Lab band    . Cholecystectomy    . Cesarean section    . Lumbar microdiscectomy     Family History  Problem Relation Age of Onset  . Lupus Mother   . Heart disease Father MI @ age 41    .  Heart disease                                                     Grandfather                                            MI @ age 15  History   Social History  . Marital Status: Single    Spouse Name: N/A    Number of Children: N/A  . Years of Education: N/A   Occupational History  . Not on file.   Social History Main Topics  . Smoking status: Never Smoker   . Smokeless tobacco: Not on file  . Alcohol Use: Yes  . Drug Use: No  . Sexually Active: Not on file   Other Topics Concern  . Not on file   Social History Narrative  . No narrative  on file    Review of Systems: Pertinent items are noted in HPI.  Physical Exam: Blood pressure 110/77, pulse 76, temperature 97.8 F (36.6 C), temperature source Oral, resp. rate 11, height 5\' 5"  (1.651 m), weight 99.791 kg (220 lb), SpO2 100.00%. General: alert, cooperative, resting comfortably in bed in no apparent distress Neck: supple, no lymphadenopathy, no JVD Lungs: clear to ascultation bilaterally, normal work of respiration, no wheezes, rales, ronchi Heart: regular rate and rhythm, no murmurs, gallops, or rubs Extremities: 2+ DP/PT pulses bilaterally, no cyanosis, clubbing, + trace pitting edema Neurologic: alert & oriented X3, cranial nerves grossly II-XII intact, strength grossly intact, sensation intact to light touch  Lab results: Basic Metabolic Panel:  Recent Labs  16/10/96 0910  NA 140  K 3.8  CL 111  CO2 23  GLUCOSE 90  BUN 18  CREATININE 0.77  CALCIUM 8.6   Liver Function Tests: No results found for this basename: AST, ALT, ALKPHOS, BILITOT, PROT, ALBUMIN,  in the last 72 hours No results found for this basename: LIPASE, AMYLASE,  in the last 72 hours No results found for this basename: AMMONIA,  in the last 72 hours CBC:  Recent Labs  05/02/12 0910  WBC 7.1  HGB 13.3  HCT 39.2  MCV 97.8  PLT 248   Cardiac Enzymes: Troponin (Point of Care Test)  Recent Labs  05/02/12 1258  TROPIPOC 0.00    BNP: No results found for this basename: PROBNP,  in the last 72 hours D-Dimer: No results found for this basename: DDIMER,  in the last 72 hours CBG: No results found for this basename: GLUCAP,  in the last 72 hours Hemoglobin A1C: No results found for this basename: HGBA1C,  in the last 72 hours Fasting Lipid Panel: No results found for this basename: CHOL, HDL, LDLCALC, TRIG,  CHOLHDL, LDLDIRECT,  in the last 72 hours Thyroid Function Tests: No results found for this basename: TSH, T4TOTAL, FREET4, T3FREE, THYROIDAB,  in the last 72 hours Anemia  Panel: No results found for this basename: VITAMINB12, FOLATE, FERRITIN, TIBC, IRON, RETICCTPCT,  in the last 72 hours Coagulation: No results found for this basename: LABPROT, INR,  in the last 72 hours Urine Drug Screen: Drugs of Abuse  No results found for this basename: labopia,  cocainscrnur,  labbenz,  amphetmu,  thcu,  labbarb    Alcohol Level: No results found for this basename: ETH,  in the last 72 hours Urinalysis: No results found for this basename: COLORURINE, APPERANCEUR, LABSPEC, PHURINE, GLUCOSEU, HGBUR, BILIRUBINUR, KETONESUR, PROTEINUR, UROBILINOGEN, NITRITE, LEUKOCYTESUR,  in the last 72 hours Misc. Labs:   Imaging results:  Dg Chest 2 View  05/02/2012  *RADIOLOGY REPORT*  Clinical Data: Chest pain  CHEST - 2 VIEW  Comparison: September 02, 2009  Findings: Lungs clear.  Heart size and pulmonary vascularity are normal.  No adenopathy.  No pneumothorax.  No bone lesions.  IMPRESSION: No abnormality noted.   Original Report Authenticated By: Bretta Bang, M.D.    Other results: EKG 05/02/2012:  SINUS RHYTHM ~ normal P axis, V-rate 50- 99 BORDERLINE LEFT AXIS DEVIATION ~ QRS axis (-15,-29) EARLY PRECORDIAL R/S TRANSITION ~ QRS area positive in V2 BORDERLINE T ABNORMALITIES, ANT-LAT LEADS ~ T flat/neg, I aVL V2-V6 Borderline ECG when compared with ECG of 09/03/2009, Nonspecific ST abnormality in the Anterolateral leads are now Present  Assessment & Plan: Patient is a 42 yo female with a history of GERD, anxiety, asthma, migraines, and Lap-band surgery (HTN resolved), and FH significant for heart disease, who presents to the ED with chest pain.   1. Chest Pain: Patient experienced 2 episodes of chest pain today, first of which resolved with nitroglycerin. EKG was largely unchanged from previous ECG (08/31/2009), without ST elevations or other indications of an acute cardiac event. Patient experienced similar chest pain in 2011, and stress test performed then was negative. Cardiac  enzymes x2 to date have been negative. Differential includes panic attack, GERD, or Prinzmetal's angina. Patient has not previously experienced panic attack, and GERD is unlikely given presentation. Prinzmetal's angina can be diagnosed with ST elevations in ECG, but was not performed during acute episode. - Admit to Internal Medicine - Cardiac enzymes x3 - Nitroglycerin prn - ECG in AM - Urine tox screen  2. Anxiety: - Celexa 40mg  daily - Klonipin 0.5mg  bid prn  3. GERD: - Omeprazole 40mg  daily  4. Asthma: - Continue Budesonide, Singulair, Albuterol home meds.  5. F/E/N: - Normal diet.  6. Dispo: Disposition is deferred at this time, awaiting improvement of current medical problems. Anticipated discharge tomrorow. Patient will be requiring PCP follow-up after discharge. The patient does not have transportation limitations that hinder transportation to clinic appointments.  This is a Psychologist, occupational Note.  The care of the patient was discussed with Dr. Dorise Hiss and the assessment and plan was formulated with their assistance.  Please see their note for official documentation of the patient encounter.   SignedCheryl Flash 05/02/2012, 2:54 PM

## 2012-05-02 NOTE — H&P (Addendum)
Internal Medicine Teaching Service Resident Admission Note Date: 05/02/2012  Patient name: Natasha [name] Medical record number: [id number] Date of birth: 01-13-1971 Age: 42 y.o. Gender: female PCP: Hadley Pen, MD  Medical Service:  I have reviewed the note by Cheryl Flash MS 4 and was present during the interview and physical exam.  Please see below for findings, assessment, and plan.  Chief Complaint: Chest pain  History of Present Illness: The patient is a 42 YO female who comes into the ED today for chest pain at her work. She initially felt a heaviness in her chest which was accompanied by cold sweat and dizziness. She was given nitroglycerin in the ED and it alleviated. She did have another similar episode in the ED which not did alleviate with nitroglycerin and is still present while we examine her. She has had this happen before and states that she had stress test afterwards which was normal. She has PMH of HTN and asthma, GERD. She states that this is different from her GERD problems. She states her breathing is fine and no nausea or vomiting with these episodes. She has some anxiety but no history of panic attacks. She states her anxiety is generalized and fairly well controlled. Patient denies any congestion, constipation, diarrhea, cough, fevers, chills, abdominal pain, leg pain or swelling.   Meds:  (Not in a hospital admission)  Allergies: Allergies as of 05/02/2012 - Review Complete 05/02/2012  Allergen Reaction Noted  . Dipyridamole  01/17/2011  . Inderal (propranolol hcl)  01/17/2011  . Midodrine  01/17/2011  . Shellfish allergy  05/02/2012    Past Medical History: Medical Student note reviewed  Family History: Medical Student note reviewed  Social History: Medical Student note reviewed  Surgical History: Medical Student note reviewed  Review of System: Medical Student note reviewed  Physical Exam: Blood pressure 110/77, pulse 76, temperature 97.8 F (36.6  C), temperature source Oral, resp. rate 11, height 5\' 5"  (1.651 m), weight 220 lb (99.791 kg), SpO2 100.00%. General: resting in bed, pleasant HEENT: PERRL, EOMI, no scleral icterus Cardiac: RRR, no rubs, murmurs or gallops, no tenderness on palpation of chest wall Pulm: clear to auscultation bilaterally, moving normal volumes of air Abd: soft, nontender, nondistended, BS present Ext: warm and well perfused, no pedal edema Neuro: alert and oriented X3, cranial nerves II-XII grossly intact   Labs: Reviewed as noted in the Electronic Record  Imaging: Reviewed as noted in the Electronic Record  Assessment & Plan by Problem:  Chest pain - The pain does sound like atypical chest pain. Will cycle cardiac enzymes. She is a fairly low risk individual (5% risk with considering atypical chest pain). Normal stress test back in 2011. Will repeat EKG in the morning. Less likely to be GERD or panic attack. Could be prinzmetal's angina and will try to capture chest pain episode on EKG.  -Watch on telemetry -cycle CE -repeat EKG in AM and prn if pain -Oxygen saturations normal -morphine for pain -nitro prn for chest pain -beta blocker if needed tomorrow   Asthma - Well controlled and will continue home meds of bucesonide, albuterol prn, montelukast   GERD (gastroesophageal reflux disease) - Will use protonix while inpatient to control symptoms   Anxiety - Will continue klonopin 0.5 mg BID but would think an SSRI would be better in the long run. Will leave to PCP.  DVT ppx - lovenox 40 mg [place] daily  Signed: Genella Mech 05/02/2012, 3:52 PM

## 2012-05-03 LAB — CBC
MCV: 97.9 fL (ref 78.0–100.0)
Platelets: 250 10*3/uL (ref 150–400)
RBC: 3.88 MIL/uL (ref 3.87–5.11)
RDW: 12.9 % (ref 11.5–15.5)
WBC: 7.6 10*3/uL (ref 4.0–10.5)

## 2012-05-03 LAB — BASIC METABOLIC PANEL
CO2: 23 mEq/L (ref 19–32)
Chloride: 110 mEq/L (ref 96–112)
Creatinine, Ser: 0.85 mg/dL (ref 0.50–1.10)
GFR calc Af Amer: >90 mL/min (ref >90)
Potassium: 4.1 mEq/L (ref 3.5–5.1)
Sodium: 138 mEq/L (ref 135–145)

## 2012-05-03 LAB — TROPONIN I: Troponin I: <0.3 ng/mL (ref <0.30)

## 2012-05-03 LAB — RAPID URINE DRUG SCREEN, HOSP PERFORMED
Amphetamines: NOT DETECTED
Cocaine: NOT DETECTED
Opiates: NOT DETECTED
Tetrahydrocannabinol: NOT DETECTED

## 2012-05-03 MED ORDER — OMEPRAZOLE 40 MG PO CPDR: 40.0000 mg | DELAYED_RELEASE_CAPSULE | Freq: Two times a day (BID) | ORAL | Status: DC

## 2012-05-03 MED ORDER — DICLOFENAC SODIUM 75 MG PO TBEC: 75.0000 mg | DELAYED_RELEASE_TABLET | Freq: Two times a day (BID) | ORAL | Status: DC | PRN

## 2012-05-03 MED ORDER — ASPIRIN 81 MG PO TBEC: 81.0000 mg | DELAYED_RELEASE_TABLET | Freq: Every day | ORAL | Status: AC

## 2012-05-03 NOTE — Progress Notes (Deleted)
Hospital Course: Patient is a 42 yo female with a history of GERD, anxiety, asthma, migraines, and morbid obesity s/p Lap-band surgery, and FH significant for heart disease, who presented to the ED on 4/3 with chest pain. Patient experienced 2 episodes of chest pain, described as pressure and tightness of several hours in duration, both resolving with nitroglycerin. ECG was largely unchanged from previous ECG (08/31/2009), without ST elevations or other indications of an acute cardiac event. Patient experienced similar chest pain in 2011, and stress test performed then was negative. Cardiac enzymes were negative x4. Overnight telemetry was unremarkable, and patient did well overnight without additional episodes of chest pain. Patient will follow-up with cardiologist for stress test and GI for EGD (workup for GI etiology and/or ulcers s/p lap-band surgery).

## 2012-05-03 NOTE — Discharge Summary (Signed)
Internal Medicine Teaching [facility] Discharge Note  Name: Natasha [name] MRN: [id number] DOB: Dec 11, 1970 42 y.o.  Date of Admission: 05/02/2012  8:49 AM Date of Discharge: 05/03/2012 Attending Physician: Lars Mage, MD  Discharge Diagnosis: Principal Problem:   Chest pain Active Problems:   Asthma   GERD (gastroesophageal reflux disease)   Anxiety   Discharge Medications:   Medication List    STOP taking these medications       GOODY HEADACHE PO      TAKE these medications       albuterol 108 (90 BASE) MCG/ACT inhaler  Commonly known as:  PROVENTIL HFA;VENTOLIN HFA  Inhale 2 puffs into the lungs every 6 (six) hours as needed for wheezing.     aspirin 81 MG EC tablet  Take 1 tablet (81 mg total) by mouth daily.     budesonide 3 MG 24 hr capsule  Commonly known as:  ENTOCORT EC  Take 3 mg by mouth daily.     citalopram 40 MG tablet  Commonly known as:  CELEXA  Take 40 mg by mouth daily.     clonazePAM 0.5 MG tablet  Commonly known as:  KLONOPIN  Take 0.5 mg by mouth 2 (two) times daily as needed for anxiety.     diclofenac 75 MG EC tablet  Commonly known as:  VOLTAREN  Take 1 tablet (75 mg total) by mouth 2 (two) times daily as needed.     ketorolac 10 MG tablet  Commonly known as:  TORADOL  Take 10 mg by mouth every 6 (six) hours as needed for pain.     omeprazole 40 MG capsule  Commonly known as:  PRILOSEC  Take 1 capsule (40 mg total) by mouth 2 (two) times daily.     SINGULAIR 10 MG tablet  Generic drug:  montelukast  Take 10 mg by mouth at bedtime.     topiramate 100 MG tablet  Commonly known as:  TOPAMAX  Take 300 mg by mouth 2 (two) times daily.        Disposition and follow-up:   [name] was discharged from [facility] in Stable condition.  At the hospital follow up visit please address return of symptom and possible referral to GI/cardiology for EGD and/or stress test.   Follow-up Appointments:     Follow-up  Information   Call ROBBINS,ROBERT A, MD. (Please make an appointment in 1-2 weeks. Also consider seeing your GI doctor and a heart doctor)    Contact information:   [address] [place] [address]      Discharge Orders   Future Appointments Provider Department Dept Phone   09/20/2012 2:15 PM [facility] MEDICAL ONCOLOGY [phone]   09/23/2012 3:00 PM Si Gaul, MD [facility] MEDICAL ONCOLOGY [phone]   Future Orders Complete By Expires     Call MD for:  As directed     Scheduling Instructions:      Chest pain or problems breathing.    Diet - low sodium heart healthy  As directed     Increase activity slowly  As directed        Consultations:  none  Procedures Performed:  Dg Chest 2 View  05/02/2012  *RADIOLOGY REPORT*  Clinical Data: Chest pain  CHEST - 2 VIEW  Comparison: September 02, 2009  Findings: Lungs clear.  Heart size and pulmonary vascularity are normal.  No adenopathy.  No pneumothorax.  No bone lesions.  IMPRESSION: No abnormality noted.   Original Report Authenticated By: Bretta Bang, M.D.    Admission HPI:  The patient is a 42 YO female who comes into the ED today for chest pain at her work. She initially felt a heaviness in her chest which was accompanied by cold sweat and dizziness. She was given nitroglycerin in the ED and it alleviated. She did have another similar episode in the ED which not did alleviate with nitroglycerin and is still present while we examine her. She has had this happen before and states that she had stress test afterwards which was normal. She has PMH of HTN and asthma, GERD. She states that this is different from her GERD problems. She states her breathing is fine and no nausea or vomiting with these episodes. She has some anxiety but no history of panic attacks. She states her anxiety is generalized and fairly well controlled. Patient denies any congestion, constipation, diarrhea,  cough, fevers, chills, abdominal pain, leg pain or swelling.   Hospital Course by problem list:    Chest pain - The pain is atypical in nature and resolved over the course of her stay. She did not have recurrence after the episode in the ED resolved. She did have some heartburn with eating and lying down. She was advised to increase her omeprazole to 40 mg BID. Her TIMI score was 2 indicating an 8% risk of event. She did have 3 troponins negative during this admission and repeat EKG without changes. She was advised to have stress test as an out-patient given her history of hypertension and family history of heart disease. She was also strongly advised to STOP goody powders and that she may need an EGD to check for ulcers if doubling her PPI dose does not help. She was advised not to lay down after eating and to not eat within 2 hours of going to bed at night. She did also have negative CXR and wells score for PE 0.     Asthma - Controlled and not in exacerbation during this admission. She was kept on prn albuterol inhaler and budesonide and singulair during this stay and at discharge.    GERD (gastroesophageal reflux disease) - She was advised to STOP goody powders and double her PPI dosing from daily omeprazole 40 mg to BID dosing. Her previous lap banding procedure will make her higher risk for GERD and she should otherwise minimize risks from food and habits.     Anxiety - She was maintained on her home dose of klonopin 0.5 mg BID however feel that if she has not trialed SSRI this may be more indicated. She does seem to have generalized anxiety rather than focal attacks.     Discharge Vitals:  BP 124/73  Pulse 76  Temp(Src) 97.7 F (36.5 C) (Oral)  Resp 20  Ht 5\' 5"  (1.651 m)  Wt 220 lb (99.791 kg)  BMI 36.61 kg/m2  SpO2 97%  Discharge Labs:  Results for orders placed during the hospital encounter of 05/02/12 (from the past 24 hour(s))  POCT I-STAT TROPONIN I     Status: None    Collection Time    05/02/12 12:58 PM      Result Value Range   Troponin i, poc 0.00  0.00 - 0.08 ng/mL   Comment 3           TSH     Status: None   Collection Time    05/02/12  6:07 PM  Result Value Range   TSH 1.513  0.350 - 4.500 uIU/mL  TROPONIN I     Status: None   Collection Time    05/02/12  6:25 PM      Result Value Range   Troponin I <0.30  <0.30 ng/mL  MRSA PCR SCREENING     Status: None   Collection Time    05/02/12  9:53 PM      Result Value Range   MRSA by PCR NEGATIVE  NEGATIVE  TROPONIN I     Status: None   Collection Time    05/02/12 11:23 PM      Result Value Range   Troponin I <0.30  <0.30 ng/mL  CBC     Status: None   Collection Time    05/03/12  4:53 AM      Result Value Range   WBC 7.6  4.0 - 10.5 K/uL   RBC 3.88  3.87 - 5.11 MIL/uL   Hemoglobin 13.1  12.0 - 15.0 g/dL   HCT 16.1  09.6 - 04.5 %   MCV 97.9  78.0 - 100.0 fL   MCH 33.8  26.0 - 34.0 pg   MCHC 34.5  30.0 - 36.0 g/dL   RDW 40.9  81.1 - 91.4 %   Platelets 250  150 - 400 K/uL  BASIC METABOLIC PANEL     Status: Abnormal   Collection Time    05/03/12  4:53 AM      Result Value Range   Sodium 138  135 - 145 mEq/L   Potassium 4.1  3.5 - 5.1 mEq/L   Chloride 110  96 - 112 mEq/L   CO2 23  19 - 32 mEq/L   Glucose, Bld 83  70 - 99 mg/dL   BUN 14  6 - 23 mg/dL   Creatinine, Ser 7.82  0.50 - 1.10 mg/dL   Calcium 8.3 (*) 8.4 - 10.5 mg/dL   GFR calc non Af Amer 84 (*) >90 mL/min   GFR calc Af Amer >90  >90 mL/min  URINE RAPID DRUG SCREEN (HOSP PERFORMED)     Status: None   Collection Time    05/03/12  6:55 AM      Result Value Range   Opiates NONE DETECTED  NONE DETECTED   Cocaine NONE DETECTED  NONE DETECTED   Benzodiazepines NONE DETECTED  NONE DETECTED   Amphetamines NONE DETECTED  NONE DETECTED   Tetrahydrocannabinol NONE DETECTED  NONE DETECTED   Barbiturates NONE DETECTED  NONE DETECTED    Signed: Genella Mech 05/03/2012, 9:49 AM   Time Spent on Discharge: 25  minutes Services Ordered on Discharge: none Equipment Ordered on Discharge: none

## 2012-09-20 ENCOUNTER — Other Ambulatory Visit (HOSPITAL_BASED_OUTPATIENT_CLINIC_OR_DEPARTMENT_OTHER): Payer: BC Managed Care – PPO | Admitting: Lab

## 2012-09-20 DIAGNOSIS — D509 Iron deficiency anemia, unspecified: Secondary | ICD-10-CM

## 2012-09-20 LAB — CBC WITH DIFFERENTIAL/PLATELET
BASO%: 0.4 % (ref 0.0–2.0)
Eosinophils Absolute: 0.4 10*3/uL (ref 0.0–0.5)
LYMPH%: 40.4 % (ref 14.0–49.7)
MCHC: 33.2 g/dL (ref 31.5–36.0)
MONO#: 0.6 10*3/uL (ref 0.1–0.9)
MONO%: 7.3 % (ref 0.0–14.0)
NEUT#: 3.8 10*3/uL (ref 1.5–6.5)
RBC: 3.99 10*6/uL (ref 3.70–5.45)
RDW: 12.9 % (ref 11.2–14.5)
WBC: 7.9 10*3/uL (ref 3.9–10.3)

## 2012-09-20 LAB — IRON AND TIBC CHCC
%SAT: 27 % (ref 21–57)
Iron: 61 ug/dL (ref 41–142)
UIBC: 167 ug/dL (ref 120–384)

## 2012-09-20 LAB — FERRITIN CHCC: Ferritin: 47 ng/ml (ref 9–269)

## 2012-09-23 ENCOUNTER — Ambulatory Visit: Payer: BC Managed Care – PPO | Admitting: Internal Medicine

## 2012-12-05 ENCOUNTER — Other Ambulatory Visit: Payer: Self-pay

## 2013-04-17 ENCOUNTER — Inpatient Hospital Stay: Payer: Self-pay | Admitting: Internal Medicine

## 2013-04-17 LAB — PRO B NATRIURETIC PEPTIDE: B-Type Natriuretic Peptide: 181 pg/mL — ABNORMAL HIGH (ref 0–125)

## 2013-04-17 LAB — BASIC METABOLIC PANEL
ANION GAP: 6 — AB (ref 7–16)
ANION GAP: 5 — AB (ref 7–16)
BUN: 6 mg/dL — ABNORMAL LOW (ref 7–18)
BUN: 5 mg/dL — AB (ref 7–18)
CALCIUM: 7.9 mg/dL — AB (ref 8.5–10.1)
CHLORIDE: 102 mmol/L (ref 98–107)
CO2: 34 mmol/L — AB (ref 21–32)
CO2: 31 mmol/L (ref 21–32)
Calcium, Total: 7.1 mg/dL — ABNORMAL LOW (ref 8.5–10.1)
Chloride: 105 mmol/L (ref 98–107)
Creatinine: 1.08 mg/dL (ref 0.60–1.30)
Creatinine: 0.94 mg/dL (ref 0.60–1.30)
EGFR (African American): >60
GLUCOSE: 101 mg/dL — AB (ref 65–99)
GLUCOSE: 100 mg/dL — AB (ref 65–99)
Osmolality: 281 (ref 275–301)
Osmolality: 279 (ref 275–301)
POTASSIUM: 2.3 mmol/L — AB (ref 3.5–5.1)
Potassium: 2.5 mmol/L — CL (ref 3.5–5.1)
SODIUM: 142 mmol/L (ref 136–145)
Sodium: 141 mmol/L (ref 136–145)

## 2013-04-17 LAB — CBC
HCT: 46 % (ref 35.0–47.0)
HGB: 15.4 g/dL (ref 12.0–16.0)
MCH: 30.7 pg (ref 26.0–34.0)
MCHC: 33.5 g/dL (ref 32.0–36.0)
MCV: 92 fL (ref 80–100)
PLATELETS: 300 10*3/uL (ref 150–440)
RBC: 5.02 10*6/uL (ref 3.80–5.20)
RDW: 13 % (ref 11.5–14.5)
WBC: 13.8 10*3/uL — ABNORMAL HIGH (ref 3.6–11.0)

## 2013-04-17 LAB — COMPREHENSIVE METABOLIC PANEL
ALBUMIN: 3.3 g/dL — AB (ref 3.4–5.0)
ALT: 51 U/L (ref 12–78)
ANION GAP: 6 — AB (ref 7–16)
AST: 35 U/L (ref 15–37)
Alkaline Phosphatase: 126 U/L — ABNORMAL HIGH
BILIRUBIN TOTAL: 0.7 mg/dL (ref 0.2–1.0)
BUN: 7 mg/dL (ref 7–18)
Calcium, Total: 8.5 mg/dL (ref 8.5–10.1)
Chloride: 97 mmol/L — ABNORMAL LOW (ref 98–107)
Co2: 34 mmol/L — ABNORMAL HIGH (ref 21–32)
Creatinine: 1.13 mg/dL (ref 0.60–1.30)
EGFR (African American): >60
EGFR (Non-African Amer.): >60
Glucose: 104 mg/dL — ABNORMAL HIGH (ref 65–99)
OSMOLALITY: 272 (ref 275–301)
POTASSIUM: 1.8 mmol/L — AB (ref 3.5–5.1)
Sodium: 137 mmol/L (ref 136–145)
TOTAL PROTEIN: 8.1 g/dL (ref 6.4–8.2)

## 2013-04-17 LAB — URINALYSIS, COMPLETE
Bacteria: NONE SEEN
Bilirubin,UR: NEGATIVE
Blood: NEGATIVE
Glucose,UR: NEGATIVE mg/dL (ref 0–75)
Ketone: NEGATIVE
Leukocyte Esterase: NEGATIVE
Nitrite: NEGATIVE
PROTEIN: NEGATIVE
Ph: 7 (ref 4.5–8.0)
SPECIFIC GRAVITY: 1.01 (ref 1.003–1.030)

## 2013-04-17 LAB — LIPASE, BLOOD: LIPASE: 124 U/L (ref 73–393)

## 2013-04-17 LAB — MAGNESIUM
MAGNESIUM: 2 mg/dL
Magnesium: 2 mg/dL

## 2013-04-18 LAB — BASIC METABOLIC PANEL
Anion Gap: 5 — ABNORMAL LOW (ref 7–16)
Anion Gap: 4 — ABNORMAL LOW (ref 7–16)
BUN: 4 mg/dL — ABNORMAL LOW (ref 7–18)
BUN: 3 mg/dL — ABNORMAL LOW (ref 7–18)
CALCIUM: 7.1 mg/dL — AB (ref 8.5–10.1)
CALCIUM: 7.1 mg/dL — AB (ref 8.5–10.1)
CHLORIDE: 111 mmol/L — AB (ref 98–107)
CO2: 28 mmol/L (ref 21–32)
CO2: 25 mmol/L (ref 21–32)
CREATININE: 0.72 mg/dL (ref 0.60–1.30)
CREATININE: 0.86 mg/dL (ref 0.60–1.30)
Chloride: 114 mmol/L — ABNORMAL HIGH (ref 98–107)
EGFR (Non-African Amer.): >60
Glucose: 81 mg/dL (ref 65–99)
Glucose: 107 mg/dL — ABNORMAL HIGH (ref 65–99)
OSMOLALITY: 283 (ref 275–301)
Osmolality: 282 (ref 275–301)
POTASSIUM: 3 mmol/L — AB (ref 3.5–5.1)
Potassium: 2.9 mmol/L — ABNORMAL LOW (ref 3.5–5.1)
Sodium: 144 mmol/L (ref 136–145)
Sodium: 143 mmol/L (ref 136–145)

## 2013-04-18 LAB — CBC WITH DIFFERENTIAL/PLATELET
BASOS PCT: 0.4 %
Basophil #: 0 10*3/uL (ref 0.0–0.1)
EOS ABS: 0.2 10*3/uL (ref 0.0–0.7)
Eosinophil %: 2.4 %
HCT: 36.2 % (ref 35.0–47.0)
HGB: 12.5 g/dL (ref 12.0–16.0)
LYMPHS ABS: 5.1 10*3/uL — AB (ref 1.0–3.6)
Lymphocyte %: 57.6 %
MCH: 32.2 pg (ref 26.0–34.0)
MCHC: 34.5 g/dL (ref 32.0–36.0)
MCV: 93 fL (ref 80–100)
MONOS PCT: 5.3 %
Monocyte #: 0.5 x10 3/mm (ref 0.2–0.9)
Neutrophil #: 3 10*3/uL (ref 1.4–6.5)
Neutrophil %: 34.3 %
PLATELETS: 233 10*3/uL (ref 150–440)
RBC: 3.88 10*6/uL (ref 3.80–5.20)
RDW: 13.6 % (ref 11.5–14.5)
WBC: 8.8 10*3/uL (ref 3.6–11.0)

## 2013-04-18 LAB — POTASSIUM: POTASSIUM: 3.1 mmol/L — AB (ref 3.5–5.1)

## 2013-04-18 LAB — CLOSTRIDIUM DIFFICILE(ARMC)

## 2013-04-18 LAB — MAGNESIUM: MAGNESIUM: 1.8 mg/dL

## 2013-04-18 LAB — TSH: THYROID STIMULATING HORM: 1.45 u[IU]/mL

## 2013-04-19 LAB — BASIC METABOLIC PANEL
ANION GAP: 6 — AB (ref 7–16)
BUN: 2 mg/dL — AB (ref 7–18)
CALCIUM: 7.2 mg/dL — AB (ref 8.5–10.1)
CO2: 23 mmol/L (ref 21–32)
CREATININE: 0.7 mg/dL (ref 0.60–1.30)
Chloride: 114 mmol/L — ABNORMAL HIGH (ref 98–107)
EGFR (African American): >60
Glucose: 76 mg/dL (ref 65–99)
OSMOLALITY: 280 (ref 275–301)
Potassium: 3.3 mmol/L — ABNORMAL LOW (ref 3.5–5.1)
Sodium: 143 mmol/L (ref 136–145)

## 2013-04-20 LAB — STOOL CULTURE

## 2013-08-11 ENCOUNTER — Ambulatory Visit (INDEPENDENT_AMBULATORY_CARE_PROVIDER_SITE_OTHER): Payer: BC Managed Care – PPO | Admitting: Internal Medicine

## 2013-08-11 ENCOUNTER — Encounter: Payer: Self-pay | Admitting: Internal Medicine

## 2013-08-11 VITALS — BP 122/88 | HR 85 | Ht 65.5 in | Wt 240.0 lb

## 2013-08-11 DIAGNOSIS — G4733 Obstructive sleep apnea (adult) (pediatric): Secondary | ICD-10-CM

## 2013-08-11 DIAGNOSIS — D509 Iron deficiency anemia, unspecified: Secondary | ICD-10-CM

## 2013-08-11 DIAGNOSIS — G478 Other sleep disorders: Secondary | ICD-10-CM

## 2013-08-11 DIAGNOSIS — G4730 Sleep apnea, unspecified: Secondary | ICD-10-CM

## 2013-08-11 NOTE — Progress Notes (Signed)
08/11/13- 20 yoF never smoker referred courtesy of Dr Chevis Pretty for sleep medicine evaluation. She complains of falling asleep driving or any time she sits quietly, worse in the past 3 or 4 months. Bedtime between 9:30 and 10:30 PM with short sleep latency, waking once or twice before up between 5:30 and 6 AM. Wakes feeling rested. Caffeine-one Pepsi. Thyroid function okay. Mother and child report snoring loudly. No history of ENT surgery. Occasionally wakes wheezing at night. Not aware of unusual behaviors, leg jerks etc. History of bariatric surgery-lap band. Followed by Hematology Oncology for quite deficiency anemia. Epworth- 15/24  Prior to Admission medications   Medication Sig Start Date End Date Taking? Authorizing Provider  albuterol (PROVENTIL HFA;VENTOLIN HFA) 108 (90 BASE) MCG/ACT inhaler Inhale 2 puffs into the lungs every 6 (six) hours as needed for wheezing.   Yes Historical Provider, MD  aspirin EC 81 MG EC tablet Take 1 tablet (81 mg total) by mouth daily. 05/03/12  Yes Judie Bonus, MD  budesonide (ENTOCORT EC) 3 MG 24 hr capsule Take 3 mg by mouth daily.   Yes Historical Provider, MD  citalopram (CELEXA) 40 MG tablet Take 40 mg by mouth daily.   Yes Historical Provider, MD  clonazePAM (KLONOPIN) 0.5 MG tablet Take 0.5 mg by mouth 2 (two) times daily as needed for anxiety.   Yes Historical Provider, MD  montelukast (SINGULAIR) 10 MG tablet Take 10 mg by mouth at bedtime.   Yes Historical Provider, MD  omeprazole (PRILOSEC) 40 MG capsule Take 1 capsule (40 mg total) by mouth 2 (two) times daily. 05/03/12  Yes Judie Bonus, MD  topiramate (TOPAMAX) 100 MG tablet Take 300 mg by mouth 2 (two) times daily.   Yes Historical Provider, MD   Past Medical History  Diagnosis Date  . Hypertension   . GERD (gastroesophageal reflux disease)   . Asthma   . Anxiety   . Depression   . Obesity   . PONV (postoperative nausea and vomiting)   . Chest pain on exertion 08/2010; 05/02/2012  .  Pneumonia 2000's  . Iron deficiency anemia   . Migraine   . Degenerative joint disease     "lower back" (05/02/2012)  . Kidney stones     "never had OR" (05/02/2012)   Past Surgical History  Procedure Laterality Date  . Breast reduction surgery Bilateral 2001  . Laparoscopic gastric banding  2009  . Cesarean section  2004  . Lumbar microdiscectomy  2006  . Cholecystectomy  2008  . Iud removal  2010   Family History  Problem Relation Age of Onset  . Lupus Mother   . Heart disease Father    History   Social History  . Marital Status: Single    Spouse Name: N/A    Number of Children: N/A  . Years of Education: N/A   Occupational History  . Not on file.   Social History Main Topics  . Smoking status: Never Smoker   . Smokeless tobacco: Never Used  . Alcohol Use: Yes     Comment: 05/02/2012 "mixed drink maybe q 6 months"  . Drug Use: No  . Sexual Activity: Yes   Other Topics Concern  . Not on file   Social History Narrative  . No narrative on file   ROS-see HPI Constitutional:   No-   weight loss, night sweats, fevers, chills, +fatigue, lassitude. HEENT:   +headaches, difficulty swallowing, tooth/dental problems, sore throat,       No-  sneezing, itching, ear ache, nasal congestion, post nasal drip,  CV:  No-   chest pain, orthopnea, PND, swelling in lower extremities, anasarca,                                  dizziness, palpitations Resp: No-   shortness of breath with exertion or at rest.              No-   productive cough,  No non-productive cough,  No- coughing up of blood.              No-   change in color of mucus.  No- wheezing.   Skin: No-   rash or lesions. GI:  +heartburn, indigestion, abdominal pain, nausea, vomiting, diarrhea,                 change in bowel habits, loss of appetite GU: No-   dysuria, change in color of urine, no urgency or frequency.  No- flank pain. MS:  No-   joint pain or swelling.  No- decreased range of motion.  No- back  pain. Neuro-     nothing unusual Psych:  No- change in mood or affect. No depression +anxiety.  No memory loss.  OBJ- Physical Exam General- Alert, Oriented, Affect-appropriate, Distress- none acute, obese Skin- rash-none, lesions- none, excoriation- none Lymphadenopathy- none Head- atraumatic            Eyes- Gross vision intact, PERRLA, conjunctivae and secretions clear            Ears- Hearing, canals-normal            Nose- Clear, no-Septal dev, mucus, polyps, erosion, perforation             Throat- Mallampati III-IV , mucosa clear , drainage- none, tonsils- atrophic Neck- flexible , trachea midline, no stridor , thyroid nl, carotid no bruit Chest - symmetrical excursion , unlabored           Heart/CV- RRR , no murmur , no gallop  , no rub, nl s1 s2                           - JVD- none , edema- none, stasis changes- none, varices- none           Lung- clear to P&A, wheeze- none, cough- none , dullness-none, rub- none           Chest wall-  Abd- tender-no, distended-no, bowel sounds-present, HSM- no Br/ Gen/ Rectal- Not done, not indicated Extrem- cyanosis- none, clubbing, none, atrophy- none, strength- nl Neuro- grossly intact to observation

## 2013-08-11 NOTE — Patient Instructions (Signed)
Order- schedule split protocol NPSG       Dx OSA  Please call as needed 

## 2013-08-12 DIAGNOSIS — R0683 Snoring: Secondary | ICD-10-CM | POA: Insufficient documentation

## 2013-08-12 NOTE — Assessment & Plan Note (Signed)
History and exam would be consistent with clinically significant obstructive sleep apnea and associated hypersomnia Plan-we discussed at our scheduling a sleep study. Weight loss encouraged. Responsibility for safe driving emphasized.

## 2013-08-12 NOTE — Assessment & Plan Note (Signed)
Managed by hematology 

## 2013-09-30 ENCOUNTER — Ambulatory Visit (HOSPITAL_BASED_OUTPATIENT_CLINIC_OR_DEPARTMENT_OTHER): Payer: BC Managed Care – PPO | Attending: Internal Medicine | Admitting: Radiology

## 2013-09-30 VITALS — Ht 65.0 in | Wt 236.0 lb

## 2013-09-30 DIAGNOSIS — G4733 Obstructive sleep apnea (adult) (pediatric): Secondary | ICD-10-CM | POA: Diagnosis not present

## 2013-10-04 DIAGNOSIS — G4733 Obstructive sleep apnea (adult) (pediatric): Secondary | ICD-10-CM

## 2013-10-04 NOTE — Sleep Study (Signed)
   NAME: Docie Abramovich DATE OF BIRTH:  30-Jun-1970 MEDICAL RECORD NUMBER [id number]  LOCATION: [facility]  PHYSICIAN: YOUNG,CLINTON D  DATE OF STUDY: 09/30/2013  SLEEP STUDY TYPE: Nocturnal Polysomnogram               REFERRING PHYSICIAN: Jetty Duhamel D, MD  INDICATION FOR STUDY: Hypersomnia with sleep apnea  EPWORTH SLEEPINESS SCORE:   17/24 HEIGHT:  (165.1 cm)  WEIGHT: 236 lb (107.049 kg)    Body mass index is 39.27 kg/(m^2).  NECK SIZE: 14 in.  MEDICATIONS: Charted for review  SLEEP ARCHITECTURE: Total sleep time 430.5 minutes with sleep efficiency 96.4%. Stage I was 4.3%, stage II 76.2%, stage III 7.5%, REM 12% of total sleep time. Sleep latency 7 minutes, REM latency 322 minutes, awake after sleep onset 9.5 minutes, arousal index 5.3, bedtime medication: Singulair, Topamax, pantoprazole, Klonopin, Celexa, aspirin, Valtrex  RESPIRATORY DATA: Apnea hypopneas index (AHI) 2.2 per hour. 16 total events scored including 3 obstructive apneas and 13 hypopneas. All events were associated with supine sleep position. REM AHI 15.1 per hour. There were not enough events to permit split protocol CPAP titration.  OXYGEN DATA: Occasional moderately loud snoring with oxygen desaturation to a nadir of 89% and mean oxygen saturation 95.2% on room air.  CARDIAC DATA: Normal sinus rhythm  MOVEMENT/PARASOMNIA: A few incidental limb jerks were noted with no effect on sleep. No bathroom trips  IMPRESSION/ RECOMMENDATION:   1) Occasional respiratory events with sleep disturbance, within normal limits. AHI 2.2 per hour (the normal range for adults is an AHI from 0-5 events per hour).   Waymon Budge Diplomate, American Board of Sleep Medicine  ELECTRONICALLY SIGNED ON:  10/04/2013, 12:41 PM [facility] PH: [phone]   FX: [phone] ACCREDITED BY THE AMERICAN ACADEMY OF SLEEP MEDICINE

## 2013-10-14 ENCOUNTER — Ambulatory Visit (INDEPENDENT_AMBULATORY_CARE_PROVIDER_SITE_OTHER): Payer: BC Managed Care – PPO | Admitting: Internal Medicine

## 2013-10-14 ENCOUNTER — Encounter: Payer: Self-pay | Admitting: Internal Medicine

## 2013-10-14 VITALS — BP 118/72 | HR 92 | Ht 65.5 in | Wt 243.0 lb

## 2013-10-14 DIAGNOSIS — R0609 Other forms of dyspnea: Secondary | ICD-10-CM

## 2013-10-14 DIAGNOSIS — R0683 Snoring: Secondary | ICD-10-CM

## 2013-10-14 DIAGNOSIS — R0989 Other specified symptoms and signs involving the circulatory and respiratory systems: Secondary | ICD-10-CM

## 2013-10-14 MED ORDER — AMPHETAMINE-DEXTROAMPHET ER 20 MG PO CP24: 20.0000 mg | ORAL_CAPSULE | Freq: Every day | ORAL | Status: DC

## 2013-10-14 NOTE — Patient Instructions (Signed)
Script printed for adderall 20 mg extended release, to take one each morning as needed. You will need to contact us for refills.  Please call as needed

## 2013-10-14 NOTE — Progress Notes (Signed)
08/11/13- 45 yoF never smoker referred courtesy of Dr Chevis Pretty for sleep medicine evaluation. She complains of falling asleep driving or any time she sits quietly, worse in the past 3 or 4 months. Bedtime between 9:30 and 10:30 PM with short sleep latency, waking once or twice before up between 5:30 and 6 AM. Wakes feeling rested. Caffeine-one Pepsi. Thyroid function okay. Mother and child report snoring loudly. No history of ENT surgery. Occasionally wakes wheezing at night. Not aware of unusual behaviors, leg jerks etc. History of bariatric surgery-lap band. Followed by Hematology Oncology for iron deficiency anemia. Epworth- 15/24  10/14/13- 43 yoF never smoker followed for OSA, complicated by anemia, hx bariatric sgy, GERD, asthma FOLLOWS FOR: Pt here to review sleep study. Pt states she is still having daytime sleepiness and awaking 2-3 times during the night.  NPSG 09/30/13- WNL, AHI 2.2/ hr, weight 236 lbs, moderate snore. We discussed daytime tiredness, history of cardiac sent when she fell asleep. She denies sleep paralysis, cataplexy or hypnagogic hallucination by my description. No family history of excessive sleepiness.  ROS-see HPI Constitutional:   No-   weight loss, night sweats, fevers, chills, +fatigue, lassitude. HEENT:   +headaches, difficulty swallowing, tooth/dental problems, sore throat,       No-  sneezing, itching, ear ache, nasal congestion, post nasal drip,  CV:  No-   chest pain, orthopnea, PND, swelling in lower extremities, anasarca,                                  dizziness, palpitations Resp: No-   shortness of breath with exertion or at rest.              No-   productive cough,  No non-productive cough,  No- coughing up of blood.              No-   change in color of mucus.  No- wheezing.   Skin: No-   rash or lesions. GI:  +heartburn, indigestion, abdominal pain, nausea, vomiting,  GU:  MS:  No-   joint pain or swelling.  . Neuro-     nothing unusual Psych:  No-  change in mood or affect. No depression +anxiety.  No memory loss.  OBJ- Physical Exam General- Alert, Oriented, Affect-appropriate, Distress- none acute, obese Skin- rash-none, lesions- none, excoriation- none Lymphadenopathy- none Head- atraumatic            Eyes- Gross vision intact, PERRLA, conjunctivae and secretions clear            Ears- Hearing, canals-normal            Nose- Clear, no-Septal dev, mucus, polyps, erosion, perforation             Throat- Mallampati III-IV , mucosa clear , drainage- none, tonsils- atrophic Neck- flexible , trachea midline, no stridor , thyroid nl, carotid no bruit Chest - symmetrical excursion , unlabored           Heart/CV- RRR , no murmur , no gallop  , no rub, nl s1 s2                           - JVD- none , edema- none, stasis changes- none, varices- none           Lung- clear to P&A, wheeze- none, cough- none , dullness-none, rub- none  Chest wall-  Abd-  Br/ Gen/ Rectal- Not done, not indicated Extrem- cyanosis- none, clubbing, none, atrophy- none, strength- nl Neuro- grossly intact to observation

## 2013-10-21 ENCOUNTER — Telehealth: Payer: Self-pay | Admitting: Internal Medicine

## 2013-10-21 NOTE — Telephone Encounter (Signed)
LMTCB

## 2013-10-22 NOTE — Telephone Encounter (Signed)
Pt returned call & can be reached at [phone]-let the person who answers the phone know this is a doctor's office & she will have pt paged.  Antionette Fairy

## 2013-10-22 NOTE — Telephone Encounter (Signed)
Called pt--unable to leave vmail d/t not being set up. wcb

## 2013-10-22 NOTE — Telephone Encounter (Signed)
PA # to call [phone] No ID # provided PA for adderall XR 20 mg cap Take 1 capsule QD.   Called # and form is being faxed to triage. Will await fax.  Pt is aware we are working on this

## 2013-10-22 NOTE — Telephone Encounter (Signed)
Form received and placed on CDY cart. Will forward to Katie to f/u on

## 2013-10-28 NOTE — Telephone Encounter (Signed)
Called and spoke with pt and she was a little upset that she has been waiting on this medication for 2 weeks and she is to see CY back in 6 wks to see how she is doing on this medication.  She stated that she would like to know when the medication is approved or not.  Will forward back to Faykatie to follow up on.

## 2013-10-28 NOTE — Telephone Encounter (Signed)
Pt is calling back again. Please advise Katie on status of forms! Thanks.

## 2013-10-28 NOTE — Telephone Encounter (Signed)
Pt calling back again about this want to know something

## 2013-10-28 NOTE — Telephone Encounter (Signed)
Katie, please advise on update. Thanks.  

## 2013-10-28 NOTE — Telephone Encounter (Signed)
Please let patient know that CY has completed the form for PA and I have faxed this back to [employer]-will need to give them up to 72 hours to decide on approval or denial. Thanks.

## 2013-11-04 NOTE — Telephone Encounter (Signed)
Katie, any update?

## 2013-11-04 NOTE — Telephone Encounter (Signed)
Spoke with [employer]-states their records indicate a paid claim for Rx on 10-31-13. Approved until 10-28-15.   Then rep claimed that they still need a PA-for some reason. They are faxing the information to my attention to the Triage fax machine. Will hold in my basket.

## 2013-11-11 NOTE — Assessment & Plan Note (Addendum)
Complains of daytime sleepiness, Epworth 17/24. Does not describe specific symptoms of narcolepsy/cataplexy. 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 Consider MSLT

## 2013-11-11 NOTE — Telephone Encounter (Signed)
Please advise Katie status of PA?

## 2013-11-14 ENCOUNTER — Other Ambulatory Visit: Payer: Self-pay

## 2013-11-18 NOTE — Telephone Encounter (Signed)
PA was completed and told that patient has been getting meds.

## 2013-11-19 ENCOUNTER — Telehealth: Payer: Self-pay | Admitting: Internal Medicine

## 2013-11-19 NOTE — Telephone Encounter (Signed)
With current supply, try taking one on awakening and one about 10 AM

## 2013-11-19 NOTE — Telephone Encounter (Signed)
Spoke with pt - informed her of below recs per Dr. Maple HudsonYoung.  She verbalized understanding and voiced no further questions or concerns at this time.

## 2013-11-19 NOTE — Telephone Encounter (Signed)
Spoke with pt States that she started Adderall last week  Pt states that she is having a hard time staying awake this week . Increased daytime sleepiness during the afternoon - about 5-7pm decreased energy levels.  Current dose 20mg  daily or as needed.  Pt has appt with CY on Nov 1  Pt wanting to know if she can increase dose daily or PRN?  Please advise Dr Maple HudsonYoung. Thanks.

## 2013-11-26 ENCOUNTER — Other Ambulatory Visit: Payer: Self-pay | Admitting: Nurse Practitioner

## 2013-12-08 ENCOUNTER — Encounter: Payer: Self-pay | Admitting: Internal Medicine

## 2013-12-08 ENCOUNTER — Ambulatory Visit (INDEPENDENT_AMBULATORY_CARE_PROVIDER_SITE_OTHER): Payer: BC Managed Care – PPO | Admitting: Internal Medicine

## 2013-12-08 VITALS — BP 112/86 | HR 92 | Ht 65.0 in | Wt 234.2 lb

## 2013-12-08 DIAGNOSIS — G4710 Hypersomnia, unspecified: Secondary | ICD-10-CM

## 2013-12-08 MED ORDER — METHYLPHENIDATE HCL ER 20 MG PO TBCR: EXTENDED_RELEASE_TABLET | ORAL | Status: DC

## 2013-12-08 NOTE — Patient Instructions (Signed)
Our goal is bedtime around 10:00 PM, aiming for 8 hours of night time sleep in your bed  Take the clonazepam 0.5 mg about 9:45 PM, so it has time to kick in for sleep and time to clear out by wake-up at 6:AM.  If you miss, don't take the clonazepam later, because it will carry on to add to your morning sleepiness.  We are changing from Adderall for now to ritalin. Take one on waking, and if needed, a second sometime between 10:00AM and 2:00 PM.  Work on the timing to avoid being kept awake later than 10:00PM

## 2013-12-08 NOTE — Progress Notes (Signed)
08/11/13- [age] yoF never smoker referred courtesy of Dr Chevis PrettyMezer for sleep medicine evaluation. She complains of falling asleep driving or any time she sits quietly, worse in the past 3 or 4 months. Bedtime between 9:30 and 10:30 PM with short sleep latency, waking once or twice before up between 5:30 and 6 AM. Wakes feeling rested. Caffeine-one Pepsi. Thyroid function okay. Mother and child report snoring loudly. No history of ENT surgery. Occasionally wakes wheezing at night. Not aware of unusual behaviors, leg jerks etc. History of bariatric surgery-lap band. Followed by Hematology Oncology for iron deficiency anemia. Epworth- 15/24  10/14/13- 43 yoF never smoker followed for OSA, complicated by anemia, hx bariatric sgy, GERD, asthma FOLLOWS FOR: Pt here to review sleep study. Pt states she is still having daytime sleepiness and awaking 2-3 times during the night.  NPSG 09/30/13- WNL, AHI 2.2/ hr, weight 236 lbs, moderate snore. We discussed daytime tiredness, history of cardiac stent She denies sleep paralysis, cataplexy or hypnagogic hallucination by my description. No family history of excessive sleepiness..  12/08/13- 43 yoF never smoker followed for OSA, complicated by anemia, hx bariatric sgy, GERD, asthma Still feeling sleepy when waking up in the mornings.  Does well staying awake until around lunchtime then sleepiness starts to return.   Adderall has been a big help using 20 mg XR , one on waking and 1 around 10 AM Asleep in her chair when she gets home around 7 PM. Gets up in the middle of the night and goes to bed, taking clonazepam 0.5 mg, then wakes at 6 AM. Little dream recall.  ROS- see HPI Constitutional:   No-   weight loss, night sweats, fevers, chills, +fatigue, lassitude. HEENT:   +headaches, difficulty swallowing, tooth/dental problems, sore throat,       No-  sneezing, itching, ear ache, nasal congestion, post nasal drip,  CV:  No-   chest pain, orthopnea, PND, swelling in lower  extremities, anasarca, dizziness, palpitations Resp: No-   shortness of breath with exertion or at rest.              No-   productive cough,  No non-productive cough,  No- coughing up of blood.              No-   change in color of mucus.  No- wheezing.   Skin: No-   rash or lesions. GI:  +heartburn, indigestion, abdominal pain, nausea, vomiting,  GU:  MS:  No-   joint pain or swelling.  . Neuro-     nothing unusual Psych:  No- change in mood or affect. No depression +anxiety.  No memory loss.  OBJ- Physical Exam General- Alert, Oriented, Affect-appropriate, Distress- none acute, obese Skin- rash-none, lesions- none, excoriation- none Lymphadenopathy- none Head- atraumatic            Eyes- Gross vision intact, PERRLA, conjunctivae and secretions clear            Ears- Hearing, canals-normal            Nose- Clear, no-Septal dev, mucus, polyps, erosion, perforation             Throat- Mallampati III-IV , mucosa clear , drainage- none, tonsils- atrophic Neck- flexible , trachea midline, no stridor , thyroid nl, carotid no bruit Chest - symmetrical excursion , unlabored           Heart/CV- RRR , no murmur , no gallop  , no rub, nl s1 s2                           -  JVD- none , edema- none, stasis changes- none, varices- none           Lung- clear to P&A, wheeze- none, cough- none , dullness-none, rub- none           Chest wall-  Abd-  Br/ Gen/ Rectal- Not done, not indicated Extrem- cyanosis- none, clubbing, none, atrophy- none, strength- nl Neuro- grossly intact to observation

## 2013-12-28 DIAGNOSIS — G4710 Hypersomnia, unspecified: Secondary | ICD-10-CM | POA: Insufficient documentation

## 2013-12-28 NOTE — Assessment & Plan Note (Signed)
She is sleeping pretty continuously between 7 PM when she falls asleep on the sofa, and 6 AM when she gets up. She is using clonazepam when she goes to bed sometime after midnight. This long-acting medication is likely still in her system in the morning. Plan-take clonazepam about 9:45 PM. Try to make  Bedtime 10 PM. If this dose is missed, don't take it  later. We will try to get a more regular schedule established. During the day try taking Ritalin LA instead of Adderall

## 2014-02-09 ENCOUNTER — Ambulatory Visit (INDEPENDENT_AMBULATORY_CARE_PROVIDER_SITE_OTHER): Payer: Managed Care, Other (non HMO) | Admitting: Internal Medicine

## 2014-02-09 ENCOUNTER — Encounter: Payer: Self-pay | Admitting: Internal Medicine

## 2014-02-09 VITALS — BP 120/80 | HR 87 | Ht 65.0 in | Wt 226.2 lb

## 2014-02-09 DIAGNOSIS — J4521 Mild intermittent asthma with (acute) exacerbation: Secondary | ICD-10-CM

## 2014-02-09 DIAGNOSIS — G4710 Hypersomnia, unspecified: Secondary | ICD-10-CM

## 2014-02-09 MED ORDER — METHYLPHENIDATE HCL ER 20 MG PO TBCR: EXTENDED_RELEASE_TABLET | ORAL | Status: DC

## 2014-02-09 MED ORDER — ARMODAFINIL 150 MG PO TABS: 150.0000 mg | ORAL_TABLET | Freq: Every day | ORAL | Status: DC

## 2014-02-09 MED ORDER — AZITHROMYCIN 250 MG PO TABS: ORAL_TABLET | ORAL | Status: DC

## 2014-02-09 NOTE — Progress Notes (Signed)
08/11/13- 73 yoF never smoker referred courtesy of Dr Chevis Pretty for sleep medicine evaluation. She complains of falling asleep driving or any time she sits quietly, worse in the past 3 or 4 months. Bedtime between 9:30 and 10:30 PM with short sleep latency, waking once or twice before up between 5:30 and 6 AM. Wakes feeling rested. Caffeine-one Pepsi. Thyroid function okay. Mother and child report snoring loudly. No history of ENT surgery. Occasionally wakes wheezing at night. Not aware of unusual behaviors, leg jerks etc. History of bariatric surgery-lap band. Followed by Hematology Oncology for iron deficiency anemia. Epworth- 15/24  10/14/13- 43 yoF never smoker followed for OSA, complicated by anemia, hx bariatric sgy, GERD, asthma FOLLOWS FOR: Pt here to review sleep study. Pt states she is still having daytime sleepiness and awaking 2-3 times during the night.  NPSG 09/30/13- WNL, AHI 2.2/ hr, weight 236 lbs, moderate snore. We discussed daytime tiredness, history of cardiac stent She denies sleep paralysis, cataplexy or hypnagogic hallucination by my description. No family history of excessive sleepiness..  12/08/13- 43 yoF never smoker followed for OSA, complicated by anemia, hx bariatric sgy, GERD, asthma Still feeling sleepy when waking up in the mornings.  Does well staying awake until around lunchtime then sleepiness starts to return.   Adderall has been a big help using 20 mg XR , one on waking and 1 around 10 AM Asleep in her chair when she gets home around 7 PM. Gets up in the middle of the night and goes to bed, taking clonazepam 0.5 mg, then wakes at 6 AM. Little dream recall.  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 Meds are helping to stay more alert during the day. Morning sickness blamed on Ritalin because it goes away when  she stops Ritalin. She still feels better off with Ritalin ER twice daily then with Adderall. Denies pregnancy. Says she is getting enough sleep. Has productive cough and chest congestion, green-yellow sputum 5 days. Little wheezing  ROS- see HPI Constitutional:   No-   weight loss, night sweats, fevers, chills, +fatigue, lassitude. HEENT:   +headaches, difficulty swallowing, tooth/dental problems, sore throat,       No-  sneezing, itching, ear ache, nasal congestion, post nasal drip,  CV:  No-   chest pain, orthopnea, PND, swelling in lower extremities, anasarca, dizziness, palpitations Resp: No-   shortness of breath with exertion or at rest.              +   productive cough,  No non-productive cough,  No- coughing up of blood.              + change in color of mucus.  No- wheezing.   Skin: No-   rash or lesions. GI:  +heartburn, indigestion, abdominal pain, nausea, vomiting,  GU:  MS:  No-   joint pain or swelling.  . Neuro-     nothing unusual Psych:  No- change in mood or affect. No depression +anxiety.  No memory loss.  OBJ- Physical Exam General- + yawning, Oriented, Affect-appropriate, Distress- none acute, obese Skin- rash-none, lesions- none, excoriation- none Lymphadenopathy- none Head- atraumatic            Eyes- Gross vision intact, PERRLA, conjunctivae and secretions clear            Ears- Hearing, canals-normal  Nose- + stuffy, no-Septal dev, mucus, polyps, erosion, perforation             Throat- Mallampati III-IV , mucosa clear , drainage- none, tonsils- atrophic Neck- flexible , trachea midline, no stridor , thyroid nl, carotid no bruit Chest - symmetrical excursion , unlabored           Heart/CV- RRR , no murmur , no gallop  , no rub, nl s1 s2                           - JVD- none , edema- none, stasis changes- none, varices- none           Lung- clear to P&A, wheeze- none, cough + raspy , dullness-none, rub- none           Chest wall-  Abd-  Br/ Gen/  Rectal- Not done, not indicated Extrem- cyanosis- none, clubbing, none, atrophy- none, strength- nl Neuro- grossly intact to observation

## 2014-02-09 NOTE — Patient Instructions (Addendum)
Script for Zpak sent  Leave citalopram off while taking this.  Samples x 1 pack Nuvigil 150 mg, 1 each morning as needed. Try this instead of ritalin.

## 2014-02-15 NOTE — Assessment & Plan Note (Signed)
Plan-samples of Nuvigil 150 mg to try one daily

## 2014-02-15 NOTE — Assessment & Plan Note (Signed)
Recent exacerbation consistent with infection. Otherwise usually well controlled. Plan-Z-Pak, fluids

## 2014-03-30 IMAGING — CT CT HEAD WITHOUT CONTRAST
3 of 4 series · 16 of 47 positions shown, 19 images · non-contrast
Comparison: None.

CLINICAL DATA: Syncopal episode today. Bruising and swelling to
left cheek.

EXAM:
CT HEAD WITHOUT CONTRAST
CT MAXILLOFACIAL WITHOUT CONTRAST
TECHNIQUE: Multidetector CT imaging of the head and maxillofacial structures
were performed using the standard protocol without intravenous
contrast. Multiplanar CT image reconstructions of the maxillofacial
structures were also generated.

[Series 3: max soft · axial · 0.31mm/px · z∈[-180,-52]mm · 10 of 72 slices shown, 13 images]
[im 4/72  brain]
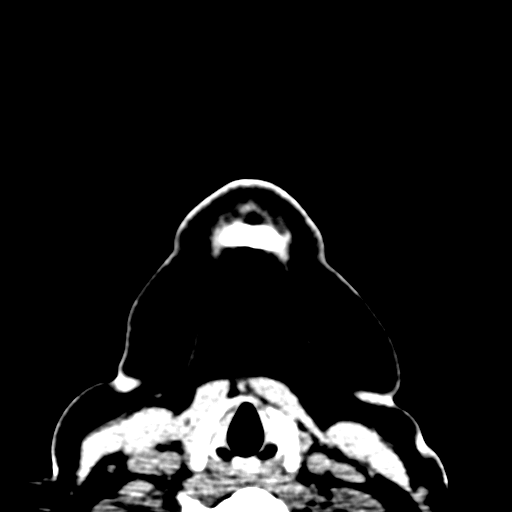
[im 4/72  bone]
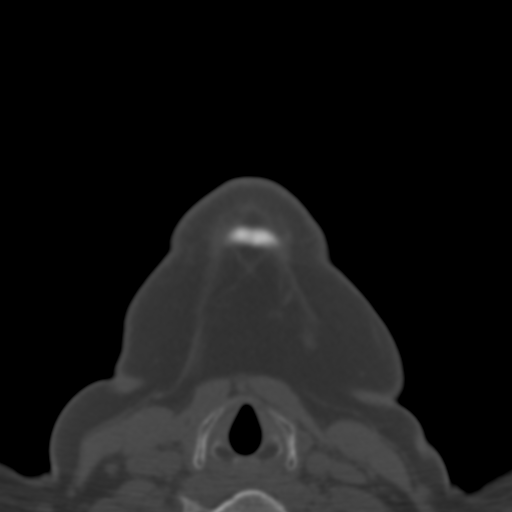
[im 11/72  brain]
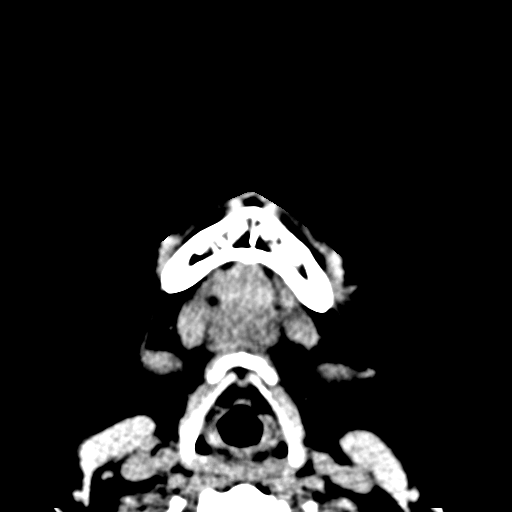
[im 18/72  brain]
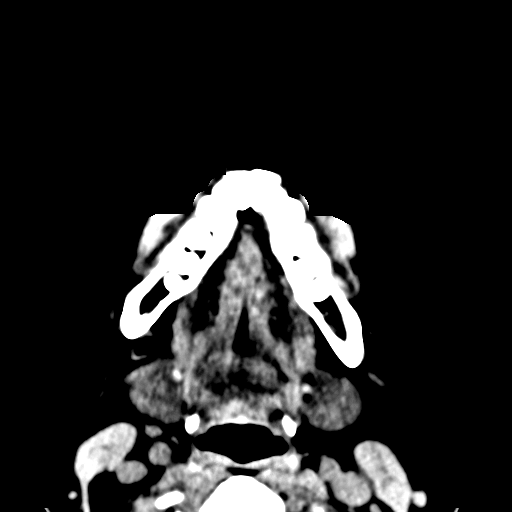
[im 25/72  brain]
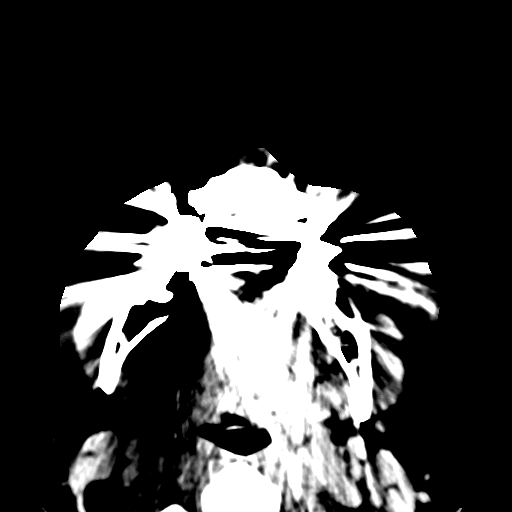
[im 32/72  brain]
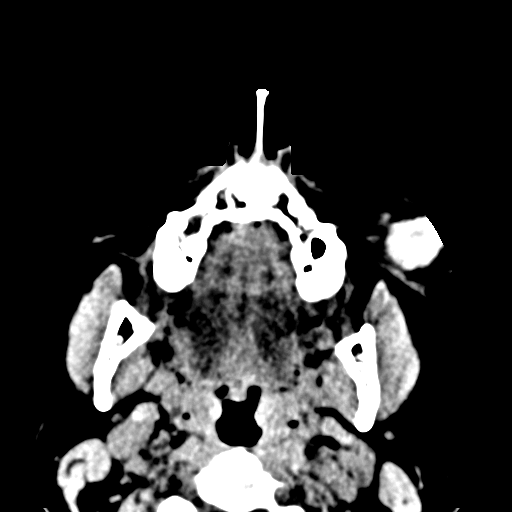
[im 32/72  bone]
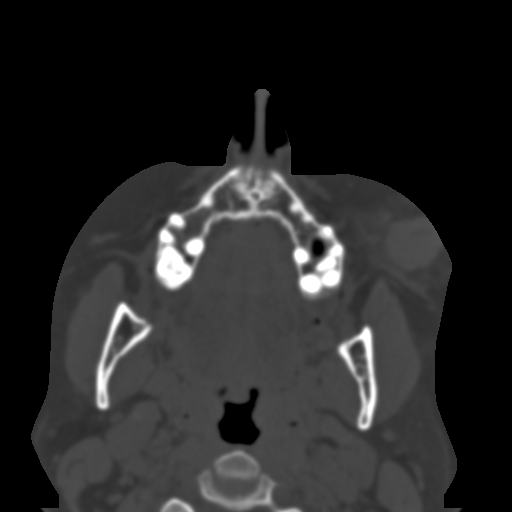
[im 40/72  brain]
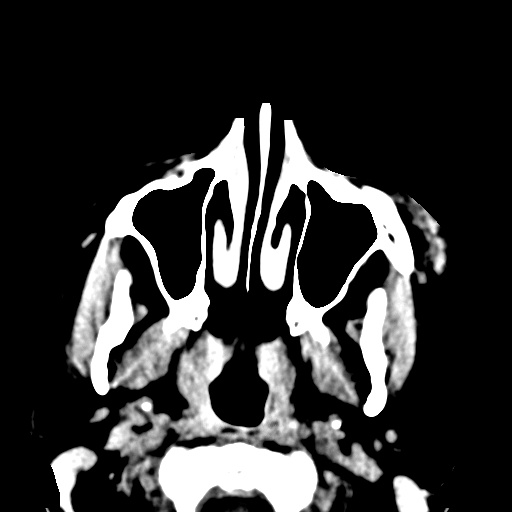
[im 47/72  brain]
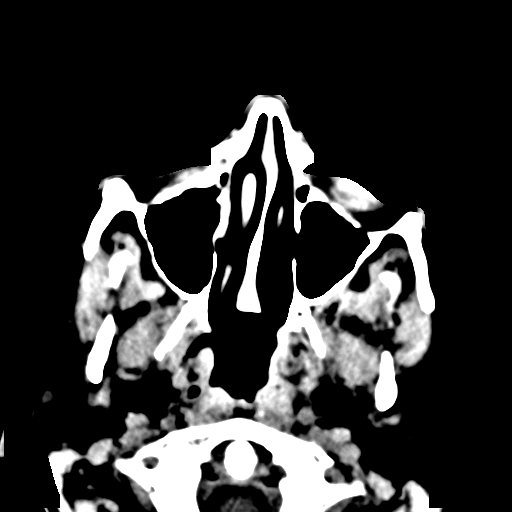
[im 54/72  brain]
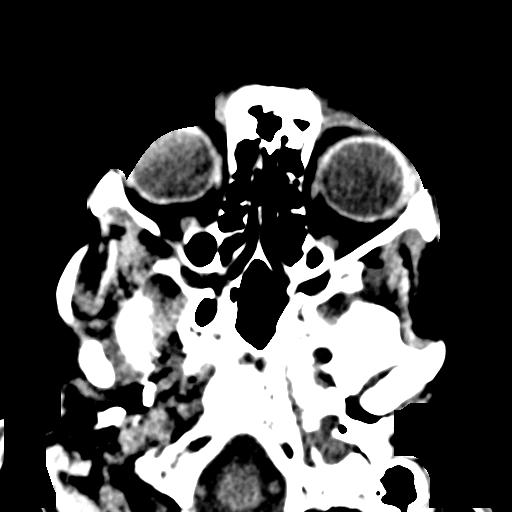
[im 61/72  brain]
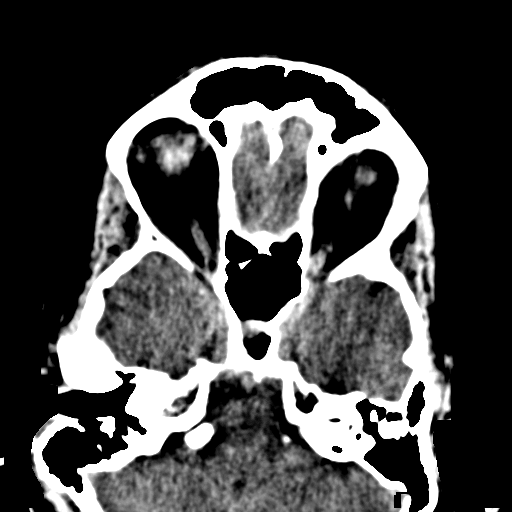
[im 61/72  bone]
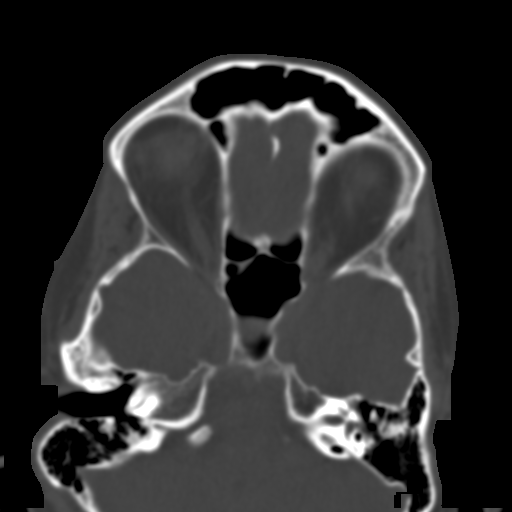
[im 68/72  brain]
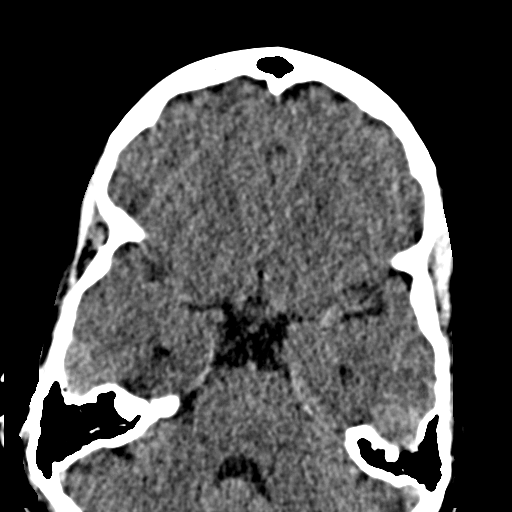

[Series 6: coronal soft · coronal · 0.29mm/px · 3 of 80 slices shown]
[im 27/80  brain]
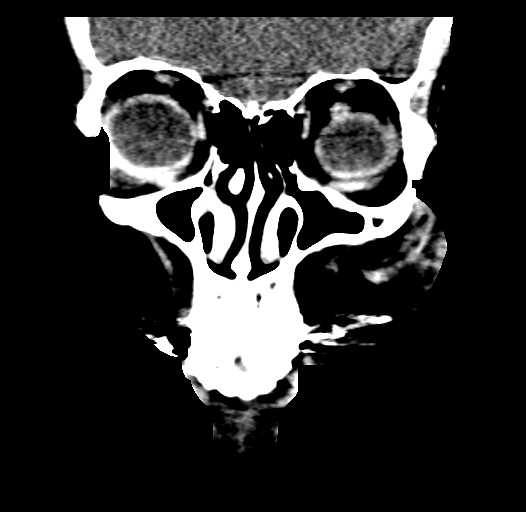
[im 36/80  brain]
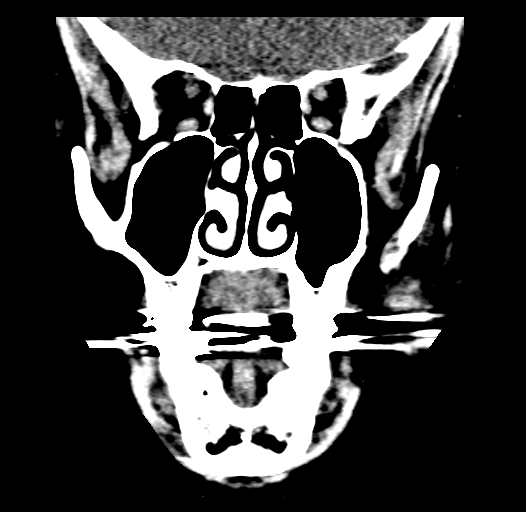
[im 44/80  brain]
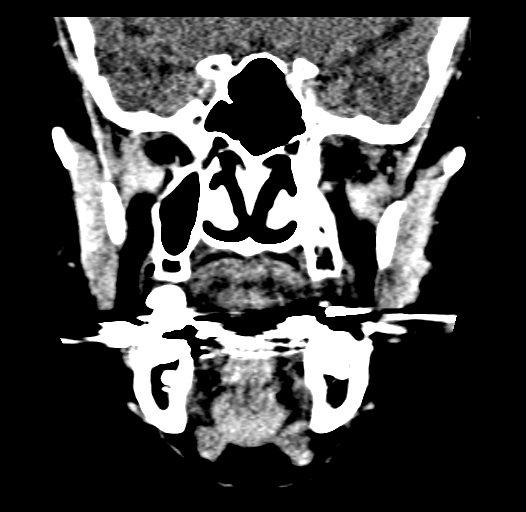

[Series 7: sagittal soft · sagittal · 0.31mm/px · 3 of 80 slices shown]
[im 27/80  brain]
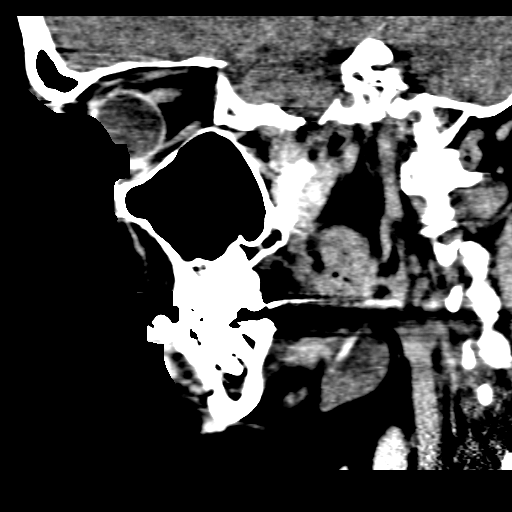
[im 40/80  brain]
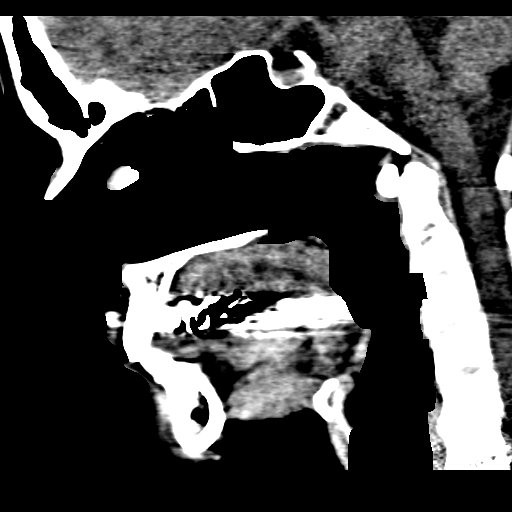
[im 53/80  brain]
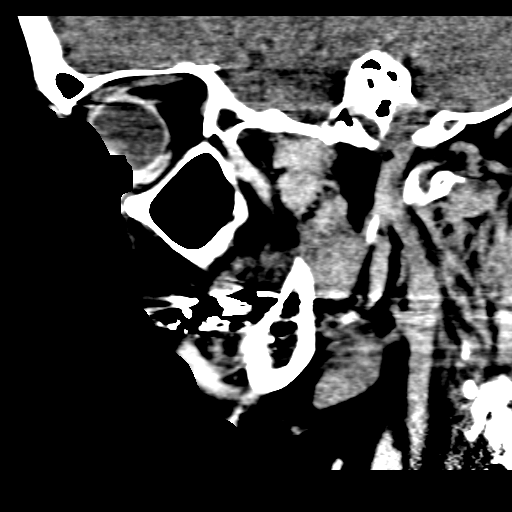

[16 of 47 positions shown; findings below may reference images not displayed]

FINDINGS: CT HEAD FINDINGS

The ventricles and sulci are within normal limits for age. There is
no evidence of acute infarct, intracranial hemorrhage, mass, midline
shift, or extra-axial collection. The orbits are unremarkable. The
visualized paranasal sinuses and mastoid air cells are clear. There
is no evidence of acute fracture.

CT MAXILLOFACIAL FINDINGS

There is left cheek soft tissue swelling with a 2.0 x 1.6 cm
hematoma present. No maxillofacial fracture is identified. Right
jugulodigastric lymph node measures 12 mm in short axis. Orbits are
unremarkable. There is mild ethmoid air cell mucosal thickening
bilaterally. Minimal right maxillary sinus mucosal thickening is
also noted. No sinus air-fluid levels are present. The mastoid air
cells are clear. Moderate disc space narrowing and mild endplate
spurring are present at C5-6.
IMPRESSION: 1. No evidence of acute intracranial abnormality.
2. Left cheek hematoma.  No maxillofacial fracture.

## 2014-04-20 ENCOUNTER — Ambulatory Visit (INDEPENDENT_AMBULATORY_CARE_PROVIDER_SITE_OTHER): Payer: Managed Care, Other (non HMO) | Admitting: Internal Medicine

## 2014-04-20 ENCOUNTER — Encounter: Payer: Self-pay | Admitting: Internal Medicine

## 2014-04-20 VITALS — BP 122/76 | HR 93 | Ht 65.0 in | Wt 218.6 lb

## 2014-04-20 DIAGNOSIS — G4710 Hypersomnia, unspecified: Secondary | ICD-10-CM

## 2014-04-20 MED ORDER — METHYLPHENIDATE HCL ER 20 MG PO TBCR: EXTENDED_RELEASE_TABLET | ORAL | Status: DC

## 2014-04-20 NOTE — Assessment & Plan Note (Addendum)
Idiopathic hypersomnia Plan- reviewed good sleep hygiene and medication options. She is satisfied to continue with Ritalin which is familiar to her.

## 2014-04-20 NOTE — Patient Instructions (Signed)
Ok to continue Ritalin 20 mg ER 0 to 2 daily as needed.  Please call if we can help

## 2014-04-20 NOTE — Progress Notes (Signed)
08/11/13- [age] yoF never smoker referred courtesy of Dr Chevis PrettyMezer for sleep medicine evaluation. She complains of falling asleep driving or any time she sits quietly, worse in the past 3 or 4 months. Bedtime between 9:30 and 10:30 PM with short sleep latency, waking once or twice before up between 5:30 and 6 AM. Wakes feeling rested. Caffeine-one Pepsi. Thyroid function okay. Mother and child report snoring loudly. No history of ENT surgery. Occasionally wakes wheezing at night. Not aware of unusual behaviors, leg jerks etc. History of bariatric surgery-lap band. Followed by Hematology Oncology for iron deficiency anemia. Epworth- 15/24  10/14/13- 43 yoF never smoker followed for OSA, complicated by anemia, hx bariatric sgy, GERD, asthma FOLLOWS FOR: Pt here to review sleep study. Pt states she is still having daytime sleepiness and awaking 2-3 times during the night.  NPSG 09/30/13- WNL, AHI 2.2/ hr, weight 236 lbs, moderate snore. We discussed daytime tiredness, history of cardiac stent She denies sleep paralysis, cataplexy or hypnagogic hallucination by my description. No family history of excessive sleepiness..  12/08/13- 43 yoF never smoker followed for OSA, complicated by anemia, hx bariatric sgy, GERD, asthma Still feeling sleepy when waking up in the mornings.  Does well staying awake until around lunchtime then sleepiness starts to return.   Adderall has been a big help using 20 mg XR , one on waking and 1 around 10 AM Asleep in her chair when she gets home around 7 PM. Gets up in the middle of the night and goes to bed, taking clonazepam 0.5 mg, then wakes at 6 AM. Little dream recall.  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 Meds are helping to stay more alert during the day. Morning sickness blamed on Ritalin because it goes away when  she stops Ritalin. She still feels better off with Ritalin ER twice daily than with Adderall. Denies pregnancy. Says she is getting enough sleep. Has productive cough and chest congestion, green-yellow sputum 5 days. 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 Ritalin 20 mg extended release used once or twice daily might be associated with some increase in headaches but no palpitations. Sometimes nausea on an empty stomach. She is going back to a headache specialist.  ROS- see HPI Constitutional:   No-   weight loss, night sweats, fevers, chills, +fatigue, lassitude. HEENT:   +headaches, difficulty swallowing, tooth/dental problems, sore throat,       No-  sneezing, itching, ear ache, nasal congestion, post nasal drip,  CV:  No-   chest pain, orthopnea, PND, swelling in lower extremities, anasarca, dizziness, palpitations Resp: No-   shortness of breath with exertion or at rest.               productive cough,  No non-productive cough,  No- coughing up of blood.              change in color of mucus.  No- wheezing.   Skin: No-   rash or lesions. GI:  +heartburn, indigestion, abdominal pain, nausea, vomiting,  GU:  MS:  No-   joint pain or swelling.  . Neuro-     nothing unusual Psych:  No- change in mood or affect.  No depression +anxiety.  No memory loss.  OBJ- Physical Exam General- + yawning, Oriented, Affect-appropriate, Distress- none acute, obese Skin- rash-none, lesions- none, excoriation- none Lymphadenopathy- none Head- atraumatic            Eyes- Gross vision intact, PERRLA, conjunctivae and secretions clear            Ears- Hearing, canals-normal            Nose- + stuffy, no-Septal dev, mucus, polyps, erosion, perforation             Throat- Mallampati III-IV , mucosa clear , drainage- none, tonsils-  atrophic Neck- flexible , trachea midline, no stridor , thyroid nl, carotid no bruit Chest - symmetrical excursion , unlabored           Heart/CV- RRR , no murmur , no gallop  , no rub, nl s1 s2                           - JVD- none , edema- none, stasis changes- none, varices- none           Lung- clear to P&A, wheeze- none, cough -none , dullness-none, rub- none           Chest wall-  Abd-  Br/ Gen/ Rectal- Not done, not indicated Extrem- cyanosis- none, clubbing, none, atrophy- none, strength- nl Neuro- grossly intact to observation

## 2014-05-23 NOTE — Consult Note (Signed)
Pt seen and examined. Please see Dawn Harrison's notes. Pt with acute nausea/vomiting/diarrhea. Hypokalemia. Pt with known hx of microscopic colitis, whose colitis was well controlled with daily entocort. Suspect infectious process. Agree with flagyl for possible c.diff infection. IVF with repletion of k. Will follow. Thanks.  Electronic Signatures: Lutricia Feilh, Nasser Ku (MD)  (Signed on 19-Mar-15 14:23)  Authored  Last Updated: 19-Mar-15 14:23 by Lutricia Feilh, Yolette Hastings (MD)

## 2014-05-23 NOTE — Consult Note (Signed)
Chief Complaint:  Subjective/Chief Complaint Patient feels well and wants to go home. Denies any problems at the present time.   VITAL SIGNS/ANCILLARY NOTES: **Vital Signs.:   21-Mar-15 08:25  Vital Signs Type Routine  Temperature Temperature (F) 98.4  Celsius 36.8  Temperature Source oral  Pulse Pulse 86  Respirations Respirations 20  Systolic BP Systolic BP 185  Diastolic BP (mmHg) Diastolic BP (mmHg) 76  Mean BP 88  Pulse Ox % Pulse Ox % 99  Pulse Ox Activity Level  At rest  Oxygen Delivery Room Air/ 21 %   Brief Assessment:  GEN well developed, well nourished, no acute distress   Respiratory normal resp effort  no use of accessory muscles   Additional Physical Exam Alert and orientated times 3   Lab Results: Routine Chem:  21-Mar-15 04:37   Glucose, Serum 76  BUN  2  Creatinine (comp) 0.70  Sodium, Serum 143  Potassium, Serum  3.3  Chloride, Serum  114  CO2, Serum 23  Calcium (Total), Serum  7.2  Anion Gap  6  Osmolality (calc) 280  eGFR (African American) >60  eGFR (Non-African American) >60 (eGFR values <61mL/min/1.73 m2 may be an indication of chronic kidney disease (CKD). Calculated eGFR is useful in patients with stable renal function. The eGFR calculation will not be reliable in acutely ill patients when serum creatinine is changing rapidly. It is not useful in  patients on dialysis. The eGFR calculation may not be applicable to patients at the low and high extremes of body sizes, pregnant women, and vegetarians.)   Assessment/Plan:  Assessment/Plan:  Assessment Nausea vomiting and diarrhea.   Plan Patient doing well. Nothing new to add from GI point of view.   Electronic Signatures: Lucilla Lame (MD)  (Signed 21-Mar-15 08:41)  Authored: Chief Complaint, VITAL SIGNS/ANCILLARY NOTES, Brief Assessment, Lab Results, Assessment/Plan   Last Updated: 21-Mar-15 08:41 by Lucilla Lame (MD)

## 2014-05-23 NOTE — Consult Note (Signed)
Chief Complaint:  Subjective/Chief Complaint Feeling better. Still with better. K up from yesterday but still low. C.diff neg. Now the mother is also sick.   VITAL SIGNS/ANCILLARY NOTES: **Vital Signs.:   20-Mar-15 11:02  Vital Signs Type Recheck  Pulse Pulse 62  Respirations Respirations 18  Systolic BP Systolic BP 937  Diastolic BP (mmHg) Diastolic BP (mmHg) 57  Mean BP 85  Pulse Ox % Pulse Ox % 97   Brief Assessment:  GEN no acute distress   Cardiac Regular   Respiratory clear BS   Gastrointestinal Normal   Lab Results: Thyroid:  20-Mar-15 04:05   Thyroid Stimulating Hormone 1.45 (0.45-4.50 (International Unit)  ----------------------- Pregnant patients have  different reference  ranges for TSH:  - - - - - - - - - -  Pregnant, first trimetser:  0.36 - 2.50 uIU/mL)  Routine Chem:  20-Mar-15 04:05   Glucose, Serum 81  BUN  4  Creatinine (comp) 0.72  Sodium, Serum 144  Potassium, Serum  3.0  Chloride, Serum  111  CO2, Serum 28  Calcium (Total), Serum  7.1  Anion Gap  5  Osmolality (calc) 283  eGFR (African American) >60  eGFR (Non-African American) >60 (eGFR values <52mL/min/1.73 m2 may be an indication of chronic kidney disease (CKD). Calculated eGFR is useful in patients with stable renal function. The eGFR calculation will not be reliable in acutely ill patients when serum creatinine is changing rapidly. It is not useful in  patients on dialysis. The eGFR calculation may not be applicable to patients at the low and high extremes of body sizes, pregnant women, and vegetarians.)  Magnesium, Serum 1.8 (1.8-2.4 THERAPEUTIC RANGE: 4-7 mg/dL TOXIC: > 10 mg/dL  -----------------------)  Routine Hem:  20-Mar-15 04:05   WBC (CBC) 8.8  RBC (CBC) 3.88  Hemoglobin (CBC) 12.5  Hematocrit (CBC) 36.2  Platelet Count (CBC) 233  MCV 93  MCH 32.2  MCHC 34.5  RDW 13.6  Neutrophil % 34.3  Lymphocyte % 57.6  Monocyte % 5.3  Eosinophil % 2.4  Basophil % 0.4   Neutrophil # 3.0  Lymphocyte #  5.1  Monocyte # 0.5  Eosinophil # 0.2  Basophil # 0.0 (Result(s) reported on 18 Apr 2013 at 07:21AM.)   Assessment/Plan:  Assessment/Plan:  Assessment N/V/D. Hx of colitis. Prob gastroenteritis.   Plan Continue supportive care. IV hydration. K supplement. Dr. Allen Norris to see patient tomorrow.   Electronic Signatures: Verdie Shire (MD)  (Signed 20-Mar-15 15:59)  Authored: Chief Complaint, VITAL SIGNS/ANCILLARY NOTES, Brief Assessment, Lab Results, Assessment/Plan   Last Updated: 20-Mar-15 15:59 by Verdie Shire (MD)

## 2014-05-23 NOTE — Consult Note (Signed)
PATIENT NAME:  Dorna MaiURNER, Jarelly MR#:  [id number] DATE OF BIRTH:  April 28, 1970  DATE OF CONSULTATION:  04/17/2013  ATTENDING PHYSICIAN:  Dr. Eliane DecreeS. Patel CONSULTING GI: Dr. Renae FicklePaul Oh/ Rodman Keyawn S. Anwita Mencer, NP  PRIMARY CARE PHYSICIAN:  Dr. Keturah Barreobert Robbins  REASON FOR CONSULTATION: Routine microscopic colitis.  HISTORY OF PRESENT ILLNESS: Ms. Mayford Knifeurner is a very pleasant 44 year old Caucasian female who has a history of hypertension, microscopic colitis, asthma, GERD, anxiety. She presented to the Emergency Room after having sustained syncopal episode x 3. Currently she has been taking budesonide EC 3 mg tablet 1 tablet daily for the management of microscopic colitis since 2013, at which time she had a colonoscopy done by Dr. Lynnae Prudeobert Elliott and pathology revealed this diagnosis.   She developed an upper respiratory infection last Saturday. She was started on amoxicillin. On day 2 of the antibiotics, she started having profuse diarrhea, very foul-smelling. No bloody stools, significant for nausea, no vomiting. She states these symptoms in presentation are very different from her known history of microscopic colitis. Bowels have been moving at least every 30 minutes and watery stools. Numerous times a day greater than at least 10, with associated nocturnal bowel movements. Her potassium was 1.8 on admission. No abdominal pain. Did have a fever 2 days prior to being seen in acute care for the upper respiratory infection.   Her normal bowel pattern is usually once a day, as long as she is taking the budesonide. If she comes off of the medication, bowel movement is 4 to 5 times a day. Interestingly, she had also been on antibiotic therapy February of this year for strep throat. Initially she was treated with a Z-Pak and after a week of her not improving, she was given an injection of Rocephin, as well as started on Levaquin.   PAST MEDICAL HISTORY: Hypertension, microscopic colitis, asthma, GERD, anxiety.  PAST SURGICAL  HISTORY:  Cholecystectomy, history of microdiskectomy status post lap band, C-section.   ALLERGIES: INDERAL, MIDODRINE, PERSANTINE AND SEAFOOD.   CURRENT MEDICATIONS:  Budesonide 3 mg a day, citalopram 40 mg a day, clonazepam 1 mg twice a day, hydrochlorothiazide 12.5 mg once a day, singular 10 mg a day, Nasonex 50 mcg two sprays once a day, Protonix 40 mg a day, prednisone taper, Topamax 200 mg tablet 2 tablets a day, valacyclovir 500 mg 1 tablet every 12 hours.   SOCIAL HISTORY: No tobacco. No alcohol use.   FAMILY HISTORY: Father heart disease. Grandmother, maternal, pancreatic cancer at the age of 43. Uncle, maternal, history of colon cancer diagnosed at the age of 43, but was stage IV at the time. Aunt with history of malignant brain tumor at the age of 43.   REVIEW OF SYSTEMS:  CONSTITUTIONAL: Denies any weight loss, weight gain. No complaints of generalized weakness. HEENT: No blurred vision, double vision. No glaucoma.  CARDIOVASCULAR: No chest pain, heart palpitations. Significant for syncope.  PULMONARY: No coughing. No wheezing. No hemoptysis.  GASTROINTESTINAL: See history of present illness.  GENITOURINARY: Denies any hematuria, hesitancy or frequency. LYMPHATIC: No lymph node enlargement. VASCULAR:  No history of claudication symptoms.  SKIN: Denies any rashes, though she does have a left cheek hematoma from the fall; did fall in her shower.  NEUROLOGICAL: No history of CVA or TIA.  PSYCHIATRIC: History of anxiety.   PHYSICAL EXAMINATION: VITAL SIGNS: Temperature 98.6, pulse 88, respirations 18, blood pressure 119/67 with a pulse ox of 98% on room air.  GENERAL: Well-developed, overweight 44 year old Caucasian female, no acute  distress noted, very pleasant.  HEENT: Normocephalic. Traumatic with evidence of hematoma to left cheek. Pupils equal and reactive to light. Conjunctivae clear. Sclerae anicteric. Positive for glasses.  NECK: Supple. Trachea midline. No lymphadenopathy  or thyromegaly.  PULMONARY: Symmetric rise and fall of chest. Clear to auscultation throughout.  CARDIOVASCULAR: Regular rate and rhythm, S1, S2. No murmurs. No gallops.  ABDOMEN: Soft, nondistended. Bowel sounds in 4 quadrants. No bruits. No masses.  RECTAL: Deferred.  MUSCULOSKELETAL: Moving all 4 extremities. No contractures. No clubbing.  EXTREMITIES: No edema.  PSYCHIATRIC: Alert and oriented x 4. Memory grossly intact. Appropriate affect and mood.  NEUROLOGICAL: No gross neurological deficits.  LABORATORY, DIAGNOSTIC, AND RADIOLOGICAL DATA:  Chemistry panel: Glucose is 104, potassium 1.8, chloride is 97. CO2 is 34. Anion gap is low at 6. The patient has received potassium supplementation with this level pending repeat result. Albumin is 3.3 and alkaline phosphatase is 126; otherwise, hepatic panel within normal limits. CBC: WBC count is 13.8, otherwise within normal limits. Urinalysis essentially unremarkable.  CT of head without contrast revealed left cheek hematoma; otherwise, no evidence of fracture.   Chest, single view: No edema. No consolidation.   IMPRESSION: 1.  Acute onset of diarrhea, suspect Clostridium difficile is primary infectious process given recent antibiotic therapy for the past 2 months. High risk for this. 2.  Known history of microscopic colitis. 3.  Syncopal episode. 4.  Hypertension. 5.  Hypokalemia.  PLAN: The patient's presentation was discussed with Dr. Lutricia Feil. Pending stool study results, C. difficile toxin A and B, as well as other stool studies such as comprehensive. In agreement for patient to be started on metronidazole 500 mg p.o. q.8 hours as ordered. We will closely monitor. We will follow potassium level specifically.  These services provided by Rodman Key, MS, APRN, [facility], FNP under collaborative agreement with Lutricia Feil, M.D.    ____________________________ Rodman Key, NP dsh:ce D: 04/17/2013 14:08:36 ET T: 04/17/2013 16:03:10  ET JOB#: [id number]  cc: Rodman Key, NP, <Dictator> Rodman Key MD ELECTRONICALLY SIGNED 04/21/2013 16:12

## 2014-05-23 NOTE — Consult Note (Signed)
Brief Consult Note: Diagnosis: Acute diarrrhea.  Suspect infectious process. Patient at high risk for C. diff infection.  Recent antibiotic therapy with recurrent therapy over the past two months.  Hypokalemia.  Hypertension.   Consult note dictated.   Discussed with Attending MD.   Comments: Patient's presentation discussed with Dr. Lutricia FeilPaul Oh.  Pending stool study results.  In agreement for patient to be started on Flagyl 500 mg po tid after stool studies have been collected. She has received potassium supplementation.  Will continue to closely monitor.  Electronic Signatures: Rodman KeyHarrison, Macayla Ekdahl S (NP)  (Signed 19-Mar-15 14:10)  Authored: Brief Consult Note   Last Updated: 19-Mar-15 14:10 by Rodman KeyHarrison, Eriko Economos S (NP)

## 2014-05-23 NOTE — Discharge Summary (Signed)
PATIENT NAME:  Natasha [name], Natasha [name] MR#:  [id number] DATE OF BIRTH:  25-Mar-1970  DATE OF ADMISSION:  04/17/2013 DATE OF DISCHARGE:  04/19/2013  ADMITTING DIAGNOSIS: Syncope.   DISCHARGE DIAGNOSES: 1. Syncope.  2. Fall.   3. Campylobacter diarrhea.  4. Hypokalemia due to diarrhea.  5. Dehydration.  6. Hypertension. Syncope is likely related to hypertension.  7. History of colitis.  8. Asthma.  9. Gastroesophageal reflux disease.  10. Anxiety.   DISCHARGE CONDITION: Stable.   DISCHARGE MEDICATIONS: The patient is to resume budesonide 3 milligrams once daily, valacyclovir 500 milligrams twice daily, topiramate 200 milligrams 2 tablets once daily, clonazepam 1 milligrams twice daily, Nasonex 2 sprays once daily, montelukast 10 milligrams p.o. daily, citalopram 40 milligrams p.o. daily, pantoprazole 40 milligrams p.o. daily, erythromycin 500 milligrams p.o. once daily for 5 more days. The patient was advised not to take HCTZ or prednisone unless recommended by primary care physician.   HOME OXYGEN: None.   DIET: 2 grams salt, mechanical soft. The patient was advised to advance the patient's diet to regular as tolerated.   ACTIVITY LIMITATIONS: As tolerated.    FOLLOWUP APPOINTMENT: With Dr. Keturah Barre in 2 days after discharge.    CONSULTANTS: Care management, social work, Dr. Bluford Kaufmann as well as Dr. Servando Snare.   RADIOLOGIC STUDIES: Chest x-ray, portable single view, 04/17/2013, revealed no edema or consolidation. CT scan of head without contrast 04/17/2013, revealed no evidence of acute intracranial abnormality. Left cheek hematoma was noted but no maxillofacial fracture was noted. CT of maxillofacial area without contrast, 04/17/2013, as above.  HISTORY OF PRESENT ILLNESS: The patient is a 44 year old Caucasian female with past medical history significant for history of chronic colitis, hypertension, asthma, gastroesophageal reflux disease, anxiety disorder, who presents to the hospital with a few  syncopes. Please refer to Dr. Eliane Decree admission note on 04/17/2013. Apparently, the patient was started with amoxicillin for upper respiratory infection after which she started having foul-smelling profuse diarrhea as well as nausea and vomiting. She was very dehydrated and very low in potassium when she came in to the hospital to the Emergency Room and she was admitted. Her vital signs were relatively stable with blood pressure of 120/60.   The patient's lab data done in the Emergency Room on 04/17/2013, revealed potassium level of 1.8, magnesium of 2.0, lipase 124, otherwise BMP was unremarkable. The patient's bicarbonate level was 34 and glucose 104, otherwise BMP was unremarkable. Liver enzymes revealed albumin level of 3.3, alkaline phosphatase 126; otherwise, BMP was unremarkable. White blood cell count was elevated to 13.8, hemoglobin was 15.4, platelet count was 300 . Urinalysis was unremarkable. The patient's stool cultures were taken for C. difficile, which was negative. However, the patient was positive for Campylobacter AG antigen (jejuni/coli).    EKG showed sinus rhythm with short PR with occasional premature ventricular complexes, left axis deviation, incomplete right bundle branch block, left ventricular hypertrophy with repolarization abnormality, and possible lateral infarct, age undetermined, prolonged QTc to 512 milliseconds.   The patient was admitted to the hospital for further evaluation initially. Before stool cultures came back positive for campylobacter, she was initiated on IV fluids but not antibiotics. However, as soon as the patient was positive for campylobacter, she was initiated on erythromycin, after which the patient's stool frequency as well as liquidity improved significantly. She was able to eat and drink and her orthostatic vital signs were stable on the day of discharge.   It was felt that the patient is stable to be discharged  home. She is to continue antibiotic  therapy for 5 more days to complete course.   In regards to her hypokalemia, the patient's potassium level was supplemented IV as well as orally. It is recommended to follow patient's potassium level as outpatient and make decisions about supplementation orally at home if needed. The patient magnesium was followed and it was normal all her stay in the hospital time.   In regards to dehydration and hypertension, as mentioned above, the patient was advised to suspend her HCTZ and continue to drink plenty of fluids. She had no problems doing that.  In regards to history of hypertension, as mentioned above, the patient's HCTZ should be placed on hold for time being until she is seen by primary care physician. HCTZ should be resumed as soon as the patient is hemodynamically stable.   For colitis, asthma, gastroesophageal reflux disease, as well as anxiety, she is to continue her outpatient management. No changes were made.   The patient is being discharged in stable condition with the above-mentioned medications and followup.   TIME SPENT: Forty minutes.   PHYSICAL EXAMINATION:   VITAL SIGNS:  On the day of discharge, temperature was 97.7, pulse was 90, respiration rate was 20, blood pressure 114/79, saturation was 97% on room air at rest.   The patient was followed by gastroenterologist while she was in the hospital with no further recommendations about her medication therapy.    ____________________________ Katharina Caperima Garnell Begeman, MD rv:[id number] D: 04/19/2013 15:12:43 ET T: 04/19/2013 21:37:04 ET JOB#: [id number]  cc: Katharina Caperima Shaleah Nissley, MD, <Dictator> Dr. Keturah Barreobert Robbins  Janziel Hockett MD ELECTRONICALLY SIGNED 05/05/2013 12:38

## 2014-05-23 NOTE — H&P (Signed)
PATIENT NAME:  Natasha [name], Natasha [name] MR#:  [id number] DATE OF BIRTH:  1970-09-24  DATE OF ADMISSION:  04/17/2013  EMERGENCY DEPARTMENT REFERRING PHYSICIAN: Dr. Carrie Mew.   PRIMARY CARE PROVIDER: Dr. Janace Litten.   CHIEF COMPLAINT: Syncope x 3.   HISTORY OF PRESENT ILLNESS: The patient is a 44 year old white female with history of hypertension, history of chronic colitis, nonspecific; history of asthma, GERD, anxiety disorder, who reports that she had developed  an upper respiratory infection last Saturday and was started on amoxicillin. On day 2 of the antibiotic, she started having profuse diarrhea which was foul-smelling without any blood in the diarrhea; also, nausea and vomiting. This persisted and continues to have diarrheal bowel movements. She, this morning, was feeling very weak and tired and passed out and sustained a fall and was brought to the ED. In the ER, the patient was noted to be very dehydrated with severely low potassium, and we were asked to admit the patient. The patient does have history of chronic colitis, which apparently has not caused any problems recently. She has had fevers at home, but denies any chest pain or shortness of breath. Denies any urinary symptoms.   PAST MEDICAL HISTORY: Significant for:  1.  Hypertension.  2.  History of chronic nonspecific colitis.  3.  History of asthma.  4.  GERD. 5.  Anxiety.   PAST SURGICAL HISTORY: Status post cholecystectomy, history of microdiscectomy, status post lap band, status post C-section.   ALLERGIES: INDERAL, MIDODRINE, PERSANTINE AND SEAFOOD.   CURRENT MEDICATIONS AT HOME:  Budesonide 3 mg daily, citalopram 40 daily, clonazepam 1 mg p.o. b.i.d., hydrochlorothiazide 12.5 daily, Singular 10 daily, Nasonex 50 mcg 2 sprays once a day to the nose, Protonix 40 daily, prednisone taper, Topamax 200, 2 tabs daily; valacyclovir 500, 1 tab p.o. q.12.   SOCIAL HISTORY: She does not smoke. Does not drink. No drugs.   FAMILY  HISTORY: Heart disease in the father.   REVIEW OF SYSTEMS:  CONSTITUTIONAL: Denies any weight loss, weight gain. Complains of generalized weakness.  HEENT: Denies any blurred vision. No double vision. No cataracts. No glaucoma. No nasal congestion. No seasonal allergies. Denies any difficulty with swallowing.  CARDIOVASCULAR: Denies any chest pain, palpitations.  Complains of syncope.  PULMONARY: Denies any cough, wheezing. No hemoptysis. Does have a history of asthma.  GASTROINTESTINAL: Complains of nausea, vomiting, diarrhea. No hematemesis. No hematochezia. No jaundice.  GENITOURINARY: Denies any frequency, urgency or hesitancy.  LYMPHATICS: Denies any lymph node enlargement.  VASCULAR: Denies any claudication symptoms.  SKIN: Denies any rash except she does have a left cheek hematoma from the fall.  NEUROLOGIC: Denies any CVA, TIA or seizures.  PSYCHIATRIC: Does have a history of anxiety.   PHYSICAL EXAMINATION: VITAL SIGNS: Temperature 98.6, pulse 85, respirations 18, blood pressure 124/64, O2 of 96%.  GENERAL: The patient is a well-developed, well-nourished female, appears very dehydrated. Has a small hematoma on the left cheek.  HEENT: Head atraumatic, normocephalic. Pupils equally round, reactive to light and accommodation. There is no conjunctival pallor. No scleral icterus. Nasal exam shows no drainage or ulceration.  Oropharynx: Tongue is dry without any exudate.  NECK: Supple without any JVD.  CARDIOVASCULAR: Regular rate and rhythm. No murmurs, rubs, clicks or gallops. PMI is not displaced.  LUNGS: Clear to auscultation bilaterally without any rales, rhonchi, wheezing.  ABDOMEN: Soft, nontender, nondistended. Positive bowel sounds x 4. There is no guarding. No rebound.  EXTREMITIES: No clubbing, cyanosis or edema.  SKIN: No rash.  LYMPHATICS: No lymph nodes palpable.  VASCULAR: Good DP, PT pulses.  PSYCHIATRIC: Not anxious or depressed.  NEUROLOGIC: Awake, alert, oriented x  3. No focal deficits.  LYMPH NODES: Not enlarged.   LABORATORY, DIAGNOSTIC AND RADIOLOGICAL DATA: CHEST X-RAY: Negative chest x-ray. Glucose 104, BUN 7, creatinine 1.13, sodium 137, potassium 1.8, chloride 97. CO2 is 34, calcium 8.5. Lipase 124. LFTs: Total protein 8.1, albumin 3.3. Bili total is 0.7. Alk phos is 126, AST 35, ALT 51. WBC 13.8, hemoglobin 15.4. Platelet count is 300.   ASSESSMENT AND PLAN: The patient is a 44 year old white female with history of nonspecific colitis with diarrhea, severe hypokalemia.  1.  Severe hypokalemia due to gastrointestinal loss. At this time, she has received 60 mEq of p.o. KCL, as well as started on IV 40 mEq KCl. We will keep her on telemetry. We will repeat potassium levels every 6 hours, also check magnesium to make sure that is not low. If so, we will replace. I will also place potassium in her IV fluids.  2.  Syncope due to severe dehydration. At this time, we will treat her with IV fluids.  3.  Diarrhea. Differential diagnoses: Possible antibiotic associated, possible C. diff, possible flare of her colitis. We will check stool studies for C. diff. I am going to place her on empiric Flagyl with her history of having nonspecific colitis. Continue budesonide. I will ask GI to see the patient.  4. Hypotension due to volume depletion. We will give her IV fluids and follow her blood pressure.  5.  Anxiety. Continue citalopram, clonazepam as taking at home.  6.  Gastroesophageal reflux disease. We will continue Protonix.  7.  Miscellaneous: The patient will be on heparin for DVT prophylaxis.   NOTE:  In light of the patient's severe hypokalemia, her potassium will be monitored very closely. She is at a very high risk of arrhythmias and cardiac complications. She will be monitored very closely on tele. She is deemed critical care in light of her significant electrolyte imbalances.   TIME SPENT: 55 minutes.      ____________________________ Lafonda Mosses  Posey Pronto, MD shp:dmm D: 04/17/2013 10:07:00 ET T: 04/17/2013 10:41:30 ET JOB#: [id number]  cc: Estus Krakowski H. Posey Pronto, MD, <Dictator> Alric Seton MD ELECTRONICALLY SIGNED 04/18/2013 8:14

## 2014-06-26 ENCOUNTER — Telehealth: Payer: Self-pay | Admitting: Internal Medicine

## 2014-06-26 MED ORDER — METHYLPHENIDATE HCL ER 20 MG PO TBCR: EXTENDED_RELEASE_TABLET | ORAL | Status: DC

## 2014-06-26 NOTE — Telephone Encounter (Signed)
rx printed and left on CY's cart to sign.  Pt aware, verified pharmacy.  Nothing further needed.

## 2014-06-26 NOTE — Telephone Encounter (Signed)
Last OV 04/20/14 Pending OV 10/22/14 Last refill 04/20/14  CY - please advise on refill. Thanks.

## 2014-06-26 NOTE — Telephone Encounter (Signed)
Ok to refill 

## 2014-09-08 ENCOUNTER — Telehealth: Payer: Self-pay | Admitting: Internal Medicine

## 2014-09-08 MED ORDER — METHYLPHENIDATE HCL ER 20 MG PO TBCR: EXTENDED_RELEASE_TABLET | ORAL | Status: DC

## 2014-09-08 NOTE — Telephone Encounter (Signed)
Ok to refill 

## 2014-09-08 NOTE — Telephone Encounter (Signed)
Pt returned call  [phone]  [place] and [place] to get a hold of.

## 2014-09-08 NOTE — Telephone Encounter (Signed)
RX signed and placed in outgoing mail  Pt made aware. Nothing further needed.

## 2014-09-08 NOTE — Telephone Encounter (Signed)
Spoke with pt - has enough pills to last her until Saturday 09/12/14 Requests that we mail Rx to home address so that she can refill this weekend when she gets home.  Please advise on refill request below. Thanks.  (leave detailed msg on machine when call back)

## 2014-09-08 NOTE — Telephone Encounter (Signed)
lmtcb X1 for pt.  Last refill: Metadate ER  #60 1 tab po bid as directed/0 refills/ 06-26-2014 Last ov: 04/20/14 Next ov :  10/22/14  CY please advise if you're ok with this refill.  Thanks!

## 2014-09-08 NOTE — Telephone Encounter (Signed)
Rx printed and placed on CY cart to be signed.  Addressed envelope attached.  Will need to be placed in outgoing mail. Please document when mailed. Thanks.

## 2014-10-22 ENCOUNTER — Other Ambulatory Visit: Payer: Self-pay

## 2014-10-22 ENCOUNTER — Encounter: Payer: Self-pay | Admitting: Internal Medicine

## 2014-10-22 ENCOUNTER — Ambulatory Visit (INDEPENDENT_AMBULATORY_CARE_PROVIDER_SITE_OTHER): Payer: Managed Care, Other (non HMO) | Admitting: Internal Medicine

## 2014-10-22 VITALS — BP 118/80 | HR 82 | Ht 65.0 in | Wt 226.2 lb

## 2014-10-22 DIAGNOSIS — J45909 Unspecified asthma, uncomplicated: Secondary | ICD-10-CM | POA: Diagnosis not present

## 2014-10-22 DIAGNOSIS — G4710 Hypersomnia, unspecified: Secondary | ICD-10-CM | POA: Diagnosis not present

## 2014-10-22 MED ORDER — METHYLPHENIDATE HCL ER 20 MG PO TBCR: EXTENDED_RELEASE_TABLET | ORAL | Status: DC

## 2014-10-22 NOTE — Progress Notes (Signed)
08/11/13- 23 yoF never smoker referred courtesy of Dr Chevis Pretty for sleep medicine evaluation. She complains of falling asleep driving or any time she sits quietly, worse in the past 3 or 4 months. Bedtime between 9:30 and 10:30 PM with short sleep latency, waking once or twice before up between 5:30 and 6 AM. Wakes feeling rested. Caffeine-one Pepsi. Thyroid function okay. Mother and child report snoring loudly. No history of ENT surgery. Occasionally wakes wheezing at night. Not aware of unusual behaviors, leg jerks etc. History of bariatric surgery-lap band. Followed by Hematology Oncology for iron deficiency anemia. Epworth- 15/24  10/14/13- 43 yoF never smoker followed for OSA, complicated by anemia, hx bariatric sgy, GERD, asthma FOLLOWS FOR: Pt here to review sleep study. Pt states she is still having daytime sleepiness and awaking 2-3 times during the night.  NPSG 09/30/13- WNL, AHI 2.2/ hr, weight 236 lbs, moderate snore. We discussed daytime tiredness, history of cardiac stent She denies sleep paralysis, cataplexy or hypnagogic hallucination by my description. No family history of excessive sleepiness..  12/08/13- 43 yoF never smoker followed for OSA, complicated by anemia, hx bariatric sgy, GERD, asthma Still feeling sleepy when waking up in the mornings.  Does well staying awake until around lunchtime then sleepiness starts to return.   Adderall has been a big help using 20 mg XR , one on waking and 1 around 10 AM Asleep in her chair when she gets home around 7 PM. Gets up in the middle of the night and goes to bed, taking clonazepam 0.5 mg, then wakes at 6 AM. Little dream recall.  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 Meds are helping to stay more alert during the day. Morning sickness blamed on Ritalin because it goes away when  she stops Ritalin. She still feels better off with Ritalin ER twice daily than with Adderall. Denies pregnancy. Says she is getting enough sleep. Has productive cough and chest congestion, green-yellow sputum 5 days. 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 Ritalin 20 mg extended release used once or twice daily might be associated with some increase in headaches but no palpitations. Sometimes nausea on an empty stomach. She is going back to a headache specialist.  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 Just wakes up-lays there for 15-20 minutes and then dozes off.  Stress issue- just lost job She is only waking one or 2 nights out of every 2 weeks for 15 or 20 minutes and getting back to sleep on her own. She prefers no treatment for this now. Ritalin continues to work quite well for her during the daytime. Breathing is not bothering her sleep and she is not needing rescue inhaler  ROS- see HPI Constitutional:   No-   weight loss, night sweats, fevers, chills, +fatigue, lassitude. HEENT:   +headaches, difficulty swallowing, tooth/dental problems, sore throat,       No-  sneezing, itching, ear ache, nasal congestion, post nasal drip,  CV:  No-   chest pain,  orthopnea, PND, swelling in lower extremities, anasarca, dizziness, palpitations Resp: No-   shortness of breath with exertion or at rest.               productive cough,  No non-productive cough,  No- coughing up of blood.              change in color of mucus.  No- wheezing.   Skin: No-   rash or lesions. GI:  +heartburn, indigestion, abdominal pain, nausea, vomiting,  GU:   MS:  No-   joint pain or swelling.  . Neuro-     nothing unusual Psych:  No- change in mood or affect. No depression +anxiety.  No memory loss.  OBJ- Physical Exam General- Oriented, Affect-appropriate, Distress- none acute, obese Skin- rash-none, lesions- none, excoriation- none Lymphadenopathy- none Head- atraumatic            Eyes- Gross vision intact, PERRLA, conjunctivae and secretions clear            Ears- Hearing, canals-normal            Nose- + stuffy, no-Septal dev, mucus, polyps, erosion, perforation             Throat- Mallampati III-IV , mucosa clear , drainage- none, tonsils- atrophic Neck- flexible , trachea midline, no stridor , thyroid nl, carotid no bruit Chest - symmetrical excursion , unlabored           Heart/CV- RRR , no murmur , no gallop  , no rub, nl s1 s2                           - JVD- none , edema- none, stasis changes- none, varices- none           Lung- clear to P&A, wheeze- none, cough -none , dullness-none, rub- none           Chest wall-  Abd-  Br/ Gen/ Rectal- Not done, not indicated Extrem- cyanosis- none, clubbing, none, atrophy- none, strength- nl Neuro- grossly intact to observation

## 2014-10-22 NOTE — Patient Instructions (Signed)
Refill script for Ritalin as before  Please let us know if we can help

## 2014-10-23 DIAGNOSIS — J45909 Unspecified asthma, uncomplicated: Secondary | ICD-10-CM | POA: Insufficient documentation

## 2014-10-23 NOTE — Assessment & Plan Note (Signed)
Mild intermittent. Not frequently needing rescue inhaler.

## 2014-10-23 NOTE — Assessment & Plan Note (Signed)
Stress related to loss of job may be impacting her sleep a little but she is coping pretty well. Plan-okayed a refill and continue Ritalin when needed

## 2014-10-26 LAB — CYTOLOGY - PAP

## 2015-04-22 ENCOUNTER — Encounter: Payer: Self-pay | Admitting: Internal Medicine

## 2015-04-26 ENCOUNTER — Encounter: Payer: Self-pay | Admitting: Internal Medicine

## 2015-04-26 ENCOUNTER — Ambulatory Visit: Payer: Managed Care, Other (non HMO) | Admitting: Internal Medicine

## 2015-04-26 ENCOUNTER — Ambulatory Visit (INDEPENDENT_AMBULATORY_CARE_PROVIDER_SITE_OTHER): Payer: Medicaid Other | Admitting: Internal Medicine

## 2015-04-26 VITALS — BP 132/82 | HR 103 | Ht 65.0 in | Wt 247.2 lb

## 2015-04-26 DIAGNOSIS — J4531 Mild persistent asthma with (acute) exacerbation: Secondary | ICD-10-CM

## 2015-04-26 DIAGNOSIS — G4700 Insomnia, unspecified: Secondary | ICD-10-CM

## 2015-04-26 DIAGNOSIS — J4540 Moderate persistent asthma, uncomplicated: Secondary | ICD-10-CM

## 2015-04-26 MED ORDER — FLUTICASONE FUROATE-VILANTEROL 100-25 MCG/INH IN AEPB: 1.0000 | INHALATION_SPRAY | Freq: Every day | RESPIRATORY_TRACT | Status: AC

## 2015-04-26 MED ORDER — ZOLPIDEM TARTRATE 10 MG PO TABS: 5.0000 mg | ORAL_TABLET | Freq: Every evening | ORAL | Status: DC | PRN

## 2015-04-26 MED ORDER — METHYLPREDNISOLONE ACETATE 80 MG/ML IJ SUSP
80.0000 mg | Freq: Once | INTRAMUSCULAR | Status: AC
  Administered 2015-04-26: 80 mg via INTRAMUSCULAR

## 2015-04-26 NOTE — Progress Notes (Signed)
08/11/13- 89 yoF never smoker referred courtesy of Dr Chevis PrettyMezer for sleep medicine evaluation. She complains of falling asleep driving or any time she sits quietly, worse in the past 3 or 4 months. Bedtime between 9:30 and 10:30 PM with short sleep latency, waking once or twice before up between 5:30 and 6 AM. Wakes feeling rested. Caffeine-one Pepsi. Thyroid function okay. Mother and child report snoring loudly. No history of ENT surgery. Occasionally wakes wheezing at night. Not aware of unusual behaviors, leg jerks etc. History of bariatric surgery-lap band. Followed by Hematology Oncology for iron deficiency anemia. Epworth- 15/24  10/14/13- 43 yoF never smoker followed for OSA, complicated by anemia, hx bariatric sgy, GERD, asthma FOLLOWS FOR: Pt here to review sleep study. Pt states she is still having daytime sleepiness and awaking 2-3 times during the night.  NPSG 09/30/13- WNL, AHI 2.2/ hr, weight 236 lbs, moderate snore. We discussed daytime tiredness, history of cardiac stent She denies sleep paralysis, cataplexy or hypnagogic hallucination by my description. No family history of excessive sleepiness..  12/08/13- 43 yoF never smoker followed for OSA, complicated by anemia, hx bariatric sgy, GERD, asthma Still feeling sleepy when waking up in the mornings.  Does well staying awake until around lunchtime then sleepiness starts to return.   Adderall has been a big help using 20 mg XR , one on waking and 1 around 10 AM Asleep in her chair when she gets home around 7 PM. Gets up in the middle of the night and goes to bed, taking clonazepam 0.5 mg, then wakes at 6 AM. Little dream recall.  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 Meds are helping to stay more alert during the day. Morning sickness blamed on Ritalin because it goes away when  she stops Ritalin. She still feels better off with Ritalin ER twice daily than with Adderall. Denies pregnancy. Says she is getting enough sleep. Has productive cough and chest congestion, green-yellow sputum 5 days. 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 Ritalin 20 mg extended release used once or twice daily might be associated with some increase in headaches but no palpitations. Sometimes nausea on an empty stomach. She is going back to a headache specialist.  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 Just wakes up-lays there for 15-20 minutes and then dozes off.  Stress issue- just lost job She is only waking one or 2 nights out of every 2 weeks for 15 or 20 minutes and getting back to sleep on her own. She prefers no treatment for this now. Ritalin continues to work quite well for her during the daytime. Breathing is not bothering her sleep and she is not needing rescue inhaler  04/26/2015-45 year old female never smoker followed for hypersomnia without OSA, complicated by anemia, history bariatric surgery, GERD, asthma Follows for: asthma, hypersomnia. Pt c/o increased cough with yellow mucus, wheezing, SOB and chest tightness since yesterday. Pt has been using albuterol HFA with some symptom relief. Pt also recently lost her job and  is having trouble sleeping and is not very active during the day.  Not using her Metadate-ER since she is not working. Able to nap. Much stress causing insomnia now. We discussed basics and options. Has been wheezing and coughing some, needing rescue inhaler more often with mild sleep disturbance. Has  a nebulizer machine and Xopenex. Albuterol causes too much tremor. Does not have a maintenance controller.  ROS- see HPI Constitutional:   No-   weight loss, night sweats, fevers, chills, +fatigue, lassitude. HEENT:   +headaches, difficulty swallowing, tooth/dental problems, sore throat,       No-  sneezing, itching, ear ache, nasal congestion, post nasal drip,  CV:  No-   chest pain, orthopnea, PND, swelling in lower extremities, anasarca, dizziness, palpitations Resp: No-   shortness of breath with exertion or at rest.               productive cough,  No non-productive cough,  No- coughing up of blood.              change in color of mucus.  + wheezing.   Skin: No-   rash or lesions. GI:  +heartburn, indigestion, abdominal pain, nausea, vomiting,  GU:  MS:  No-   joint pain or swelling.  . Neuro-     nothing unusual Psych:  No- change in mood or affect. No depression +anxiety.  No memory loss.  OBJ- Physical Exam General- Oriented, Affect-appropriate, Distress- none acute, obese Skin- rash-none, lesions- none, excoriation- none Lymphadenopathy- none Head- atraumatic            Eyes- Gross vision intact, PERRLA, conjunctivae and secretions clear            Ears- Hearing, canals-normal            Nose- + stuffy, no-Septal dev, mucus, polyps, erosion, perforation             Throat- Mallampati III-IV , mucosa clear , drainage- none, tonsils- atrophic Neck- flexible , trachea midline, no stridor , thyroid nl, carotid no bruit Chest - symmetrical excursion , unlabored           Heart/CV- RRR , no murmur , no gallop  , no rub, nl s1 s2                           - JVD- none , edema- none, stasis changes- none, varices- none           Lung-  wheeze + bilateral unlabored, cough + mild dry , dullness-none, rub- none           Chest wall-  Abd-  Br/ Gen/ Rectal- Not done, not indicated Extrem- cyanosis- none, clubbing, none, atrophy- none, strength- nl Neuro- grossly intact to  observation

## 2015-04-26 NOTE — Patient Instructions (Signed)
Script printed for [employer] 5 mg,    1 for sleep as needed  Depo 80  Sample Breo 100    Inhale 1 puff then rinse mouth, once daily  Ok to use your nebulizer and rescue inhaler as before if needed

## 2015-04-27 DIAGNOSIS — G4700 Insomnia, unspecified: Secondary | ICD-10-CM | POA: Insufficient documentation

## 2015-04-27 NOTE — Assessment & Plan Note (Signed)
Clear association with stress of being out of work, finances difficult. Plan-discussed basics of good sleep hygiene. Avoid excessive daytime napping.  Try Ambien with discussion

## 2015-04-27 NOTE — Assessment & Plan Note (Signed)
Control is worse partly because she is out of work and having more trouble maintaining medications. Plan-discussed use of nebulizer machine. Sample Breo to try, Depo-Medrol today

## 2015-04-29 ENCOUNTER — Telehealth: Payer: Self-pay | Admitting: Internal Medicine

## 2015-04-29 MED ORDER — PREDNISONE 10 MG PO TABS: ORAL_TABLET | ORAL | Status: AC

## 2015-04-29 NOTE — Telephone Encounter (Signed)
Per 04/26/15 OV: Patient Instructions       Script printed for ambien 5 mg,    1 for sleep as needed Depo 80 Sample Breo 100    Inhale 1 puff then rinse mouth, once daily Ok o use your nebulizer and rescue inhaler as before if needed  ---  Called spoke with pt. She reports she has been using her breo. Reports she is more congested, wheezing, prod cough (green-yellow phlem). Using her rescue inhaler albuterol about every 4 hrs and alternating this with xopenex nebulizer every 4 hrs as well. Denies any f/c/s/n/s.  Please advise Dr. Maple HudsonYoung thanks  Allergies  Allergen Reactions  . Dipyridamole   . Inderal [Propranolol Hcl]   . Midodrine   . Shellfish Allergy      Current Outpatient Prescriptions on File Prior to Visit  Medication Sig Dispense Refill  . albuterol (PROVENTIL HFA;VENTOLIN HFA) 108 (90 BASE) MCG/ACT inhaler Inhale 2 puffs into the lungs every 6 (six) hours as needed for wheezing.    Marland Kitchen. aspirin EC 81 MG EC tablet Take 1 tablet (81 mg total) by mouth daily.    . budesonide (ENTOCORT EC) 3 MG 24 hr capsule Take 3 mg by mouth daily.    . citalopram (CELEXA) 40 MG tablet Take 40 mg by mouth daily.    . fluticasone furoate-vilanterol (BREO ELLIPTA) 100-25 MCG/INH AEPB Inhale 1 puff into the lungs daily. 1 each 0  . methylphenidate (METADATE ER) 20 MG ER tablet 1 twice daily as directed 60 tablet 0  . montelukast (SINGULAIR) 10 MG tablet Take 10 mg by mouth at bedtime.    . pantoprazole (PROTONIX) 40 MG tablet Take 1 tablet by mouth daily.    . rizatriptan (MAXALT) 10 MG tablet Take 1 tablet by mouth as needed.    . topiramate (TOPAMAX) 100 MG tablet Take 300 mg by mouth 2 (two) times daily.    . valACYclovir (VALTREX) 500 MG tablet Take 1 tablet by mouth as needed.    . zolpidem (AMBIEN) 10 MG tablet Take 0.5 tablets (5 mg total) by mouth at bedtime as needed for sleep. 30 tablet 0   No current facility-administered medications on file prior to visit.

## 2015-04-29 NOTE — Telephone Encounter (Signed)
Called spoke with pt. Aware of recs. Rx sent in. Nothing further needed 

## 2015-04-29 NOTE — Telephone Encounter (Signed)
Offer prednisone 10 mg, # 20, 4 X 2 DAYS, 3 X 2 DAYS, 2 X 2 DAYS, 1 X 2 DAYS  

## 2015-04-30 ENCOUNTER — Telehealth: Payer: Self-pay | Admitting: *Deleted

## 2015-04-30 NOTE — Telephone Encounter (Signed)
Filled out form for [place] [employer] and faxed to [phone]. Pt ID #[id number]  Will await response.   [facility] (p) [phone]

## 2015-05-05 NOTE — Telephone Encounter (Signed)
Ambien approved until 10/27/2015. [id number]  Pharmacy informed.

## 2015-06-06 ENCOUNTER — Emergency Department: Payer: Medicaid Other

## 2015-06-06 ENCOUNTER — Emergency Department
Admission: EM | Admit: 2015-06-06 | Discharge: 2015-06-06 | Disposition: A | Discharge status: Home / Self Care | Payer: Medicaid Other | Attending: Emergency Medicine | Admitting: Emergency Medicine

## 2015-06-06 DIAGNOSIS — Z7982 Long term (current) use of aspirin: Secondary | ICD-10-CM | POA: Insufficient documentation

## 2015-06-06 DIAGNOSIS — J45909 Unspecified asthma, uncomplicated: Secondary | ICD-10-CM | POA: Insufficient documentation

## 2015-06-06 DIAGNOSIS — I10 Essential (primary) hypertension: Secondary | ICD-10-CM | POA: Insufficient documentation

## 2015-06-06 DIAGNOSIS — Z79899 Other long term (current) drug therapy: Secondary | ICD-10-CM | POA: Diagnosis not present

## 2015-06-06 DIAGNOSIS — F329 Major depressive disorder, single episode, unspecified: Secondary | ICD-10-CM | POA: Diagnosis not present

## 2015-06-06 DIAGNOSIS — R1084 Generalized abdominal pain: Secondary | ICD-10-CM | POA: Diagnosis not present

## 2015-06-06 DIAGNOSIS — E876 Hypokalemia: Secondary | ICD-10-CM | POA: Diagnosis not present

## 2015-06-06 DIAGNOSIS — R1013 Epigastric pain: Secondary | ICD-10-CM | POA: Diagnosis present

## 2015-06-06 LAB — URINALYSIS COMPLETE WITH MICROSCOPIC (ARMC ONLY)
BILIRUBIN URINE: NEGATIVE
Bacteria, UA: NONE SEEN
GLUCOSE, UA: NEGATIVE mg/dL
Hgb urine dipstick: NEGATIVE
Leukocytes, UA: NEGATIVE
Nitrite: NEGATIVE
Protein, ur: 30 mg/dL — AB
Specific Gravity, Urine: 1.025 (ref 1.005–1.030)
pH: 5 (ref 5.0–8.0)

## 2015-06-06 LAB — CBC WITH DIFFERENTIAL/PLATELET
BASOS PCT: 0 %
Basophils Absolute: 0 10*3/uL (ref 0–0.1)
EOS PCT: 1 %
Eosinophils Absolute: 0.1 10*3/uL (ref 0–0.7)
HCT: 40.2 % (ref 35.0–47.0)
Hemoglobin: 13.2 g/dL (ref 12.0–16.0)
LYMPHS ABS: 0.9 10*3/uL — AB (ref 1.0–3.6)
Lymphocytes Relative: 17 %
MCH: 30.3 pg (ref 26.0–34.0)
MCHC: 32.8 g/dL (ref 32.0–36.0)
MCV: 92.2 fL (ref 80.0–100.0)
MONOS PCT: 6 %
Monocytes Absolute: 0.3 10*3/uL (ref 0.2–0.9)
Neutro Abs: 4.1 10*3/uL (ref 1.4–6.5)
Neutrophils Relative %: 76 %
PLATELETS: 276 10*3/uL (ref 150–440)
RBC: 4.36 MIL/uL (ref 3.80–5.20)
RDW: 15 % — ABNORMAL HIGH (ref 11.5–14.5)
WBC: 5.4 10*3/uL (ref 3.6–11.0)

## 2015-06-06 LAB — COMPREHENSIVE METABOLIC PANEL
ALK PHOS: 105 U/L (ref 38–126)
ALT: 10 U/L — AB (ref 14–54)
AST: 12 U/L — ABNORMAL LOW (ref 15–41)
Albumin: 3.3 g/dL — ABNORMAL LOW (ref 3.5–5.0)
Anion gap: 9 (ref 5–15)
BILIRUBIN TOTAL: 0.9 mg/dL (ref 0.3–1.2)
BUN: 10 mg/dL (ref 6–20)
CALCIUM: 7.8 mg/dL — AB (ref 8.9–10.3)
CO2: 19 mmol/L — AB (ref 22–32)
CREATININE: 0.85 mg/dL (ref 0.44–1.00)
Chloride: 111 mmol/L (ref 101–111)
GFR calc non Af Amer: >60 mL/min (ref >60)
Glucose, Bld: 105 mg/dL — ABNORMAL HIGH (ref 65–99)
Potassium: 2.4 mmol/L — CL (ref 3.5–5.1)
SODIUM: 139 mmol/L (ref 135–145)
TOTAL PROTEIN: 6.7 g/dL (ref 6.5–8.1)

## 2015-06-06 LAB — POCT PREGNANCY, URINE: Preg Test, Ur: NEGATIVE

## 2015-06-06 LAB — LIPASE, BLOOD: Lipase: 22 U/L (ref 11–51)

## 2015-06-06 MED ORDER — POTASSIUM CHLORIDE CRYS ER 20 MEQ PO TBCR
40.0000 meq | EXTENDED_RELEASE_TABLET | Freq: Once | ORAL | Status: AC
  Administered 2015-06-06: 40 meq via ORAL
  Filled 2015-06-06: qty 2

## 2015-06-06 MED ORDER — ONDANSETRON 4 MG PO TBDP: ORAL_TABLET | ORAL | Status: AC

## 2015-06-06 MED ORDER — ONDANSETRON HCL 4 MG/2ML IJ SOLN
4.0000 mg | Freq: Once | INTRAMUSCULAR | Status: AC
  Administered 2015-06-06: 4 mg via INTRAVENOUS
  Filled 2015-06-06: qty 2

## 2015-06-06 MED ORDER — IOPAMIDOL (ISOVUE-300) INJECTION 61%
100.0000 mL | Freq: Once | INTRAVENOUS | Status: AC | PRN
  Administered 2015-06-06: 100 mL via INTRAVENOUS

## 2015-06-06 MED ORDER — DIATRIZOATE MEGLUMINE & SODIUM 66-10 % PO SOLN
15.0000 mL | Freq: Once | ORAL | Status: AC
  Administered 2015-06-06: 15 mL via ORAL

## 2015-06-06 MED ORDER — POTASSIUM CHLORIDE 10 MEQ/100ML IV SOLN
10.0000 meq | Freq: Once | INTRAVENOUS | Status: AC
  Administered 2015-06-06: 10 meq via INTRAVENOUS
  Filled 2015-06-06: qty 100

## 2015-06-06 MED ORDER — POTASSIUM CHLORIDE CRYS ER 20 MEQ PO TBCR: 20.0000 meq | EXTENDED_RELEASE_TABLET | Freq: Every day | ORAL | Status: DC

## 2015-06-06 NOTE — ED Provider Notes (Signed)
[facility] Emergency Department Provider Note  ____________________________________________  Time seen: Approximately 11:12 AM  I have reviewed the triage vital signs and the nursing notes.   HISTORY  Chief Complaint Abdominal Pain    HPI Kenlei Safi is a 45 y.o. female with a history of a lap band procedure in 2013 but who is generally well controlled in terms of pain in medical problems who presents with acute onset epigastric pain with nausea that started about 24 hours ago.  She reports that it was mild at the beginning but has gradually gotten worse and was severe overnight last night when she was unable to sleep.  It is better today but still moderate burning and sharp epigastric pain with nausea.  She has not had any vomiting but has also not had anything to eat or drink because she is afraid it will come back up.  She has also had several loose stools today with no blood visible.  No else in the family has been ill.  She denies fever/chills, chest pain, shortness of breath, dysuria.  Nothing makes the symptoms better and movement seems to make them worse.  She has not had an adjustment to her lap band and at least 2 years.  She has not started any new medications recently.  She is also status post cholecystectomy.   Past Medical History  Diagnosis Date  . Hypertension   . GERD (gastroesophageal reflux disease)   . Asthma   . Anxiety   . Depression   . Obesity   . PONV (postoperative nausea and vomiting)   . Chest pain on exertion 08/2010; 05/02/2012  . Pneumonia 2000's  . Iron deficiency anemia   . Migraine   . Degenerative joint disease     "lower back" (05/02/2012)  . Kidney stones     "never had OR" (05/02/2012)    Patient Active Problem List   Diagnosis Date Noted  . Insomnia 04/27/2015  . Asthmatic bronchitis without complication 10/23/2014  . Hypersomnia 12/28/2013  . Snoring without sleep apnea 08/12/2013  . Chest pain 05/02/2012  .  Asthmatic bronchitis with exacerbation 05/02/2012  . GERD (gastroesophageal reflux disease) 05/02/2012  . Anxiety 05/02/2012  . Iron deficiency anemia 12/08/2010    Past Surgical History  Procedure Laterality Date  . Breast reduction surgery Bilateral 2001  . Laparoscopic gastric banding  2009  . Cesarean section  2004  . Lumbar microdiscectomy  2006  . Cholecystectomy  2008  . Iud removal  2010    Current Outpatient Rx  Name  Route  Sig  Dispense  Refill  . albuterol (PROVENTIL HFA;VENTOLIN HFA) 108 (90 BASE) MCG/ACT inhaler   Inhalation   Inhale 2 puffs into the lungs every 6 (six) hours as needed for wheezing.         Marland Kitchen aspirin EC 81 MG EC tablet   Oral   Take 1 tablet (81 mg total) by mouth daily.         . budesonide (ENTOCORT EC) 3 MG 24 hr capsule   Oral   Take 3 mg by mouth daily.         . citalopram (CELEXA) 40 MG tablet   Oral   Take 40 mg by mouth daily.         . fluticasone furoate-vilanterol (BREO ELLIPTA) 100-25 MCG/INH AEPB   Inhalation   Inhale 1 puff into the lungs daily.   1 each   0   . methylphenidate (METADATE ER)  20 MG ER tablet      1 twice daily as directed   60 tablet   0   . montelukast (SINGULAIR) 10 MG tablet   Oral   Take 10 mg by mouth at bedtime.         . ondansetron (ZOFRAN ODT) 4 MG disintegrating tablet      Allow 1-2 tablets to dissolve in your mouth every 8 hours as needed for nausea/vomiting   30 tablet   0   . pantoprazole (PROTONIX) 40 MG tablet   Oral   Take 1 tablet by mouth daily.         . potassium chloride SA (KLOR-CON M20) 20 MEQ tablet   Oral   Take 1 tablet (20 mEq total) by mouth daily.   14 tablet   0   . predniSONE (DELTASONE) 10 MG tablet      Take 4 tabs daily x 2 days, 3 tabs daily x 2 days, 2 tabs daily x 2 days, 1 tab daily x 2 days then stop   20 tablet   0   . rizatriptan (MAXALT) 10 MG tablet   Oral   Take 1 tablet by mouth as needed.         . topiramate (TOPAMAX)  100 MG tablet   Oral   Take 300 mg by mouth 2 (two) times daily.         . valACYclovir (VALTREX) 500 MG tablet   Oral   Take 1 tablet by mouth as needed.         Marland Kitchen EXPIRED: zolpidem (AMBIEN) 10 MG tablet   Oral   Take 0.5 tablets (5 mg total) by mouth at bedtime as needed for sleep.   30 tablet   0     Allergies Dipyridamole; Inderal; Midodrine; and Shellfish allergy  Family History  Problem Relation Age of Onset  . Lupus Mother   . Heart disease Father     Social History Social History  Substance Use Topics  . Smoking status: Never Smoker   . Smokeless tobacco: Never Used  . Alcohol Use: Yes     Comment: very rarely    Review of Systems Constitutional: No fever/chills Eyes: No visual changes. ENT: No sore throat. Cardiovascular: Denies chest pain. Respiratory: Denies shortness of breath. Gastrointestinal: +epigastric pain, nausea and diarrhea, no vomiting Genitourinary: Negative for dysuria. Musculoskeletal: Negative for back pain. Skin: Negative for rash. Neurological: Negative for headaches, focal weakness or numbness.  10-point ROS otherwise negative.  ____________________________________________   PHYSICAL EXAM:  VITAL SIGNS: ED Triage Vitals  Enc Vitals Group     BP 06/06/15 1026 122/65 mmHg     Pulse Rate 06/06/15 1026 98     Resp 06/06/15 1026 16     Temp 06/06/15 1026 97.9 F (36.6 C)     Temp Source 06/06/15 1026 Oral     SpO2 06/06/15 1026 99 %     Weight 06/06/15 1026 240 lb (108.863 kg)     Height 06/06/15 1026  (1.651 m)     Head Cir --      Peak Flow --      Pain Score 06/06/15 1027 8     Pain Loc --      Pain Edu? --      Excl. in GC? --     Constitutional: Alert and oriented. Well appearing and in no acute distress. Eyes: Conjunctivae are normal. PERRL. EOMI. Head: Atraumatic. Nose: No congestion/rhinnorhea. Mouth/Throat:  Mucous membranes are moist.  Oropharynx non-erythematous. Neck: No stridor.  No meningeal  signs.   Cardiovascular: Normal rate, regular rhythm. Good peripheral circulation. Grossly normal heart sounds.   Respiratory: Normal respiratory effort.  No retractions. Lungs CTAB. Gastrointestinal: Soft, obese, moderately tender to palpation of epigastrium without rebound/guarding.  Musculoskeletal: No lower extremity tenderness nor edema. No gross deformities of extremities. Neurologic:  Normal speech and language. No gross focal neurologic deficits are appreciated.  Skin:  Skin is warm, dry and intact. No rash noted. Psychiatric: Mood and affect are normal. Speech and behavior are normal.  ____________________________________________   LABS (all labs ordered are listed, but only abnormal results are displayed)  Labs Reviewed  COMPREHENSIVE METABOLIC PANEL - Abnormal; Notable for the following:    Potassium 2.4 (*)    CO2 19 (*)    Glucose, Bld 105 (*)    Calcium 7.8 (*)    Albumin 3.3 (*)    AST 12 (*)    ALT 10 (*)    All other components within normal limits  CBC WITH DIFFERENTIAL/PLATELET - Abnormal; Notable for the following:    RDW 15.0 (*)    Lymphs Abs 0.9 (*)    All other components within normal limits  URINALYSIS COMPLETEWITH MICROSCOPIC ([facility] ONLY) - Abnormal; Notable for the following:    Color, Urine YELLOW (*)    APPearance CLEAR (*)    Ketones, ur TRACE (*)    Protein, ur 30 (*)    Squamous Epithelial / LPF 0-5 (*)    All other components within normal limits  LIPASE, BLOOD  POC URINE PREG, ED  POCT PREGNANCY, URINE   ____________________________________________  EKG  None ____________________________________________  RADIOLOGY   Ct Abdomen Pelvis W Contrast  06/06/2015  CLINICAL DATA:  Patient with epigastric pain, nausea, vomiting and diarrhea. EXAM: CT ABDOMEN AND PELVIS WITH CONTRAST TECHNIQUE: Multidetector CT imaging of the abdomen and pelvis was performed using the standard protocol following bolus administration of intravenous contrast.  CONTRAST:  100mL ISOVUE-300 IOPAMIDOL (ISOVUE-300) INJECTION 61% COMPARISON:  None. FINDINGS: Lower chest: Normal heart size. No pericardial effusion. Dependent ground-glass opacities within the bilateral lower lobes most suggestive of atelectasis. No pleural effusion. Hepatobiliary: The liver is normal in size and contour. No focal hepatic lesion is identified. Patient status post cholecystectomy. No intrahepatic or extrahepatic biliary ductal dilatation. Pancreas: Unremarkable Spleen: Unremarkable Adrenals/Urinary Tract: The adrenal glands are normal. The kidneys enhance symmetrically with contrast. No hydronephrosis. There is a 7 mm nonobstructing stone within the interpolar region of the left kidney. Urinary bladder is unremarkable. No ureterolithiasis. Stomach/Bowel: Fluid is present throughout the colon. No abnormal bowel wall thickening. The appendix is normal. Gastric band is in place. Small amount of gas at the level of the gastric band is likely intraluminal as there is no surrounding inflammatory stranding or fluid. Port is located within the anterior abdominal wall. Vascular/Lymphatic: Normal caliber abdominal aorta. No retroperitoneal lymphadenopathy. Other: Intrauterine device appears in appropriate position within the uterus. Adnexal structures are unremarkable. Musculoskeletal: No aggressive or acute appearing osseous lesions. Lumbar spine degenerative changes. IMPRESSION: There is fluid throughout the colon as can be seen with diarrhea. No other acute process identified within the abdomen or pelvis. Nonobstructing left renal stone. Electronically Signed   By: Annia Beltrew  Davis M.D.   On: 06/06/2015 13:15    ____________________________________________   PROCEDURES  Procedure(s) performed: None  Critical Care performed: No ____________________________________________   INITIAL IMPRESSION / ASSESSMENT AND PLAN / ED COURSE  Pertinent  labs & imaging results that were available during my care  of the patient were reviewed by me and considered in my medical decision making (see chart for details).  The patient has few visits to emergency departments and her symptoms are generally well under control.  She has not had imaging in the last year or more that I can find.  Given her LAP-BAND history I will proceed with a CT scan of her abdomen and pelvis although explained to her that we are likely just ruling out issues and she may simply be suffering from a viral illness.  We will check basic labs and proceed with the bariatric protocol CT of her abdomen and pelvis.  She understands and agrees with this plan.  She declines pain medicine at this time although I will give her some nausea medicine while she waits and drinks the oral contrast.  ----------------------------------------- 2:05 PM on 06/06/2015 -----------------------------------------  Patient states that she does feel better.  Her CT scan was unremarkable.  Her lab workup is unremarkable except for hypokalemia, but this is chronic and she states that she has been as low as 1.8.  She does not take any potassium supplements.  I explained to her that it is important to do so and follow-up with her regular doctor.  I gave her 10 mEq by IV in the ED and 40 mEq by mouth as well as a prescription for potassium and Zofran.  I gave my usual and customary return precautions.     ____________________________________________  FINAL CLINICAL IMPRESSION(S) / ED DIAGNOSES  Final diagnoses:  Generalized abdominal pain  Chronic hypokalemia     MEDICATIONS GIVEN DURING THIS VISIT:  Medications  potassium chloride SA (K-DUR,KLOR-CON) CR tablet 40 mEq   ondansetron (ZOFRAN) injection 4 mg (4 mg Intravenous Given 06/06/15 1140)  diatrizoate meglumine-sodium (GASTROGRAFIN) 66-10 % solution 15 mL (15 mLs Oral Given 06/06/15 1135)  potassium chloride 10 mEq in 100 mL IVPB (0 mEq Intravenous Stopped 06/06/15 1407)  iopamidol (ISOVUE-300) 61 % injection  100 mL (100 mLs Intravenous Contrast Given 06/06/15 1242)     NEW OUTPATIENT MEDICATIONS STARTED DURING THIS VISIT:  New Prescriptions   ONDANSETRON (ZOFRAN ODT) 4 MG DISINTEGRATING TABLET    Allow 1-2 tablets to dissolve in your mouth every 8 hours as needed for nausea/vomiting   POTASSIUM CHLORIDE SA (KLOR-CON M20) 20 MEQ TABLET    Take 1 tablet (20 mEq total) by mouth daily.      Note:  This document was prepared using Dragon voice recognition software and may include unintentional dictation errors.   Loleta Rose, MD 06/06/15 [phone]

## 2015-06-06 NOTE — Discharge Instructions (Signed)
You have been seen in the Emergency Department (ED) for abdominal pain.  Your evaluation did not identify a clear cause of your symptoms but was generally reassuring.  Please follow up as instructed above regarding todays emergent visit and the symptoms that are bothering you.  It is also important that you take potassium supplements.  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  Return to the ED if your abdominal pain worsens or fails to improve, you develop bloody vomiting, bloody diarrhea, you are unable to tolerate fluids due to vomiting, fever greater than 101, or other symptoms that concern you.   Abdominal Pain, Adult Many things can cause abdominal pain. Usually, abdominal pain is not caused by a disease and will improve without treatment. It can often be observed and treated at home. Your health care provider will do a physical exam and possibly order blood tests and X-rays to help determine the seriousness of your pain. However, in many cases, more time must pass before a clear cause of the pain can be found. Before that point, your health care provider may not know if you need more testing or further treatment. HOME CARE INSTRUCTIONS Monitor your abdominal pain for any changes. The following actions may help to alleviate any discomfort you are experiencing:  Only take over-the-counter or prescription medicines as directed by your health care provider.  Do not take laxatives unless directed to do so by your health care provider.  Try a clear liquid diet (broth, tea, or water) as directed by your health care provider. Slowly move to a bland diet as tolerated. SEEK MEDICAL CARE IF:  You have unexplained abdominal pain.  You have abdominal pain associated with nausea or diarrhea.  You have pain when you urinate or have a bowel movement.  You experience abdominal pain that wakes you in the  night.  You have abdominal pain that is worsened or improved by eating food.  You have abdominal pain that is worsened with eating fatty foods.  You have a fever. SEEK IMMEDIATE MEDICAL CARE IF:  Your pain does not go away within 2 hours.  You keep throwing up (vomiting).  Your pain is felt only in portions of the abdomen, such as the right side or the left lower portion of the abdomen.  You pass bloody or black tarry stools. MAKE SURE YOU:  Understand these instructions.  Will watch your condition.  Will get help right away if you are not doing well or get worse.   This information is not intended to replace advice given to you by your health care provider. Make sure you discuss any questions you have with your health care provider.   Document Released: 10/26/2004 Document Revised: 10/07/2014 Document Reviewed: 09/25/2012 Elsevier Interactive Patient Education 2016 [employer].  Hypokalemia Hypokalemia means that the amount of potassium in the blood is lower than normal.Potassium is a chemical, called an electrolyte, that helps regulate the amount of fluid in the body. It also stimulates muscle contraction and helps nerves function properly.Most of the body's potassium is inside of cells, and only a very small amount is in the blood. Because the amount in the blood is so small, minor changes can be life-threatening. CAUSES  Antibiotics.  Diarrhea or vomiting.  Using laxatives too much, which can cause diarrhea.  Chronic kidney disease.  Water pills (diuretics).  Eating disorders (bulimia).  Low magnesium level.  Sweating a  lot. SIGNS AND SYMPTOMS  Weakness.  Constipation.  Fatigue.  Muscle cramps.  Mental confusion.  Skipped heartbeats or irregular heartbeat (palpitations).  Tingling or numbness. DIAGNOSIS  Your health care provider can diagnose hypokalemia with blood tests. In addition to checking your potassium level, your health care provider  may also check other lab tests. TREATMENT Hypokalemia can be treated with potassium supplements taken by mouth or adjustments in your current medicines. If your potassium level is very low, you may need to get potassium through a vein (IV) and be monitored in the hospital. A diet high in potassium is also helpful. Foods high in potassium are:  Nuts, such as peanuts and pistachios.  Seeds, such as sunflower seeds and pumpkin seeds.  Peas, lentils, and lima beans.  Whole grain and bran cereals and breads.  Fresh fruit and vegetables, such as apricots, avocado, bananas, cantaloupe, kiwi, oranges, tomatoes, asparagus, and potatoes.  Orange and tomato juices.  Red meats.  Fruit yogurt. HOME CARE INSTRUCTIONS  Take all medicines as prescribed by your health care provider.  Maintain a healthy diet by including nutritious food, such as fruits, vegetables, nuts, whole grains, and lean meats.  If you are taking a laxative, be sure to follow the directions on the label. SEEK MEDICAL CARE IF:  Your weakness gets worse.  You feel your heart pounding or racing.  You are vomiting or having diarrhea.  You are diabetic and having trouble keeping your blood glucose in the normal range. SEEK IMMEDIATE MEDICAL CARE IF:  You have chest pain, shortness of breath, or dizziness.  You are vomiting or having diarrhea for more than 2 days.  You faint. MAKE SURE YOU:   Understand these instructions.  Will watch your condition.  Will get help right away if you are not doing well or get worse.   This information is not intended to replace advice given to you by your health care provider. Make sure you discuss any questions you have with your health care provider.   Document Released: 01/16/2005 Document Revised: 02/06/2014 Document Reviewed: 07/19/2012 Elsevier Interactive Patient Education 2016 [employer]  Potassium Content of Foods Potassium is a mineral found in many foods and  drinks. It helps keep fluids and minerals balanced in your body and affects how steadily your heart beats. Potassium also helps control your blood pressure and keep your muscles and nervous system healthy. Certain health conditions and medicines may change the balance of potassium in your body. When this happens, you can help balance your level of potassium through the foods that you do or do not eat. Your health care provider or dietitian may recommend an amount of potassium that you should have each day. The following lists of foods provide the amount of potassium (in parentheses) per serving in each item. HIGH IN POTASSIUM  The following foods and beverages have 200 mg or more of potassium per serving:  Apricots, 2 raw or 5 dry (200 mg).  Artichoke, 1 medium (345 mg).  Avocado, raw,  each (245 mg).  Banana, 1 medium (425 mg).  Beans, lima, or baked beans, canned,  cup (280 mg).  Beans, white, canned,  cup (595 mg).  Beef roast, 3 oz (320 mg).  Beef, ground, 3 oz (270 mg).  Beets, raw or cooked,  cup (260 mg).  Bran muffin, 2 oz (300 mg).  Broccoli,  cup (230 mg).  Brussels sprouts,  cup (250 mg).  Cantaloupe,  cup (215 mg).  Cereal, 100% bran,  cup (200-400 mg).  Cheeseburger, single, fast food, 1 each (225-400 mg).  Chicken, 3 oz (220 mg).  Clams, canned, 3 oz (535 mg).  Crab, 3 oz (225 mg).  Dates, 5 each (270 mg).  Dried beans and peas,  cup (300-475 mg).  Figs, dried, 2 each (260 mg).  Fish: halibut, tuna, cod, snapper, 3 oz (480 mg).  Fish: salmon, haddock, swordfish, perch, 3 oz (300 mg).  Fish, tuna, canned 3 oz (200 mg).  [place] fries, fast food, 3 oz (470 mg).  Granola with fruit and nuts,  cup (200 mg).  Grapefruit juice,  cup (200 mg).  Greens, beet,  cup (655 mg).  Honeydew melon,  cup (200 mg).  Kale, raw, 1 cup (300 mg).  Kiwi, 1 medium (240 mg).  Kohlrabi, rutabaga, parsnips,  cup (280 mg).  Lentils,  cup (365  mg).  Mango, 1 each (325 mg).  Milk, chocolate, 1 cup (420 mg).  Milk: nonfat, low-fat, whole, buttermilk, 1 cup (350-380 mg).  Molasses, 1 Tbsp (295 mg).  Mushrooms,  cup (280) mg.  Nectarine, 1 each (275 mg).  Nuts: almonds, peanuts, hazelnuts, [place], cashew, mixed, 1 oz (200 mg).  Nuts, pistachios, 1 oz (295 mg).  Orange, 1 each (240 mg).  Orange juice,  cup (235 mg).  Papaya, medium,  fruit (390 mg).  Peanut butter, chunky, 2 Tbsp (240 mg).  Peanut butter, smooth, 2 Tbsp (210 mg).  Pear, 1 medium (200 mg).  Pomegranate, 1 whole (400 mg).  Pomegranate juice,  cup (215 mg).  Pork, 3 oz (350 mg).  Potato chips, salted, 1 oz (465 mg).  Potato, baked with skin, 1 medium (925 mg).  Potatoes, boiled,  cup (255 mg).  Potatoes, mashed,  cup (330 mg).  Prune juice,  cup (370 mg).  Prunes, 5 each (305 mg).  Pudding, chocolate,  cup (230 mg).  Pumpkin, canned,  cup (250 mg).  Raisins, seedless,  cup (270 mg).  Seeds, sunflower or pumpkin, 1 oz (240 mg).  Soy milk, 1 cup (300 mg).  Spinach,  cup (420 mg).  Spinach, canned,  cup (370 mg).  Sweet potato, baked with skin, 1 medium (450 mg).  Swiss chard,  cup (480 mg).  Tomato or vegetable juice,  cup (275 mg).  Tomato sauce or puree,  cup (400-550 mg).  Tomato, raw, 1 medium (290 mg).  Tomatoes, canned,  cup (200-300 mg).  [place], 3 oz (250 mg).  Wheat germ, 1 oz (250 mg).  Winter squash,  cup (250 mg).  Yogurt, plain or fruited, 6 oz (260-435 mg).  Zucchini,  cup (220 mg). MODERATE IN POTASSIUM The following foods and beverages have 50-200 mg of potassium per serving:  Apple, 1 each (150 mg).  Apple juice,  cup (150 mg).  Applesauce,  cup (90 mg).  Apricot nectar,  cup (140 mg).  Asparagus, small spears,  cup or 6 spears (155 mg).  Bagel, cinnamon raisin, 1 each (130 mg).  Bagel, egg or plain, 4 in., 1 each (70 mg).  Beans, green,  cup (90 mg).  Beans,  yellow,  cup (190 mg).  Beer, regular, 12 oz (100 mg).  Beets, canned,  cup (125 mg).  Blackberries,  cup (115 mg).  Blueberries,  cup (60 mg).  Bread, whole wheat, 1 slice (70 mg).  Broccoli, raw,  cup (145 mg).  Cabbage,  cup (150 mg).  Carrots, cooked or raw,  cup (180 mg).  Cauliflower, raw,  cup (150 mg).  Celery, raw,  cup (155 mg).  Cereal, bran flakes, cup (120-150 mg).  Cheese, cottage,  cup (110 mg).  Cherries, 10 each (150 mg).  Chocolate, 1 oz bar (165 mg).  Coffee, brewed 6 oz (90 mg).  Corn,  cup or 1 ear (195 mg).  Cucumbers,  cup (80 mg).  Egg, large, 1 each (60 mg).  Eggplant,  cup (60 mg).  Endive, raw, cup (80 mg).  English muffin, 1 each (65 mg).  Fish, orange roughy, 3 oz (150 mg).  Frankfurter, beef or pork, 1 each (75 mg).  Fruit cocktail,  cup (115 mg).  Grape juice,  cup (170 mg).  Grapefruit,  fruit (175 mg).  Grapes,  cup (155 mg).  Greens: kale, turnip, collard,  cup (110-150 mg).  Ice cream or frozen yogurt, chocolate,  cup (175 mg).  Ice cream or frozen yogurt, vanilla,  cup (120-150 mg).  Lemons, limes, 1 each (80 mg).  Lettuce, all types, 1 cup (100 mg).  Mixed vegetables,  cup (150 mg).  Mushrooms, raw,  cup (110 mg).  Nuts: walnuts, pecans, or macadamia, 1 oz (125 mg).  Oatmeal,  cup (80 mg).  Okra,  cup (110 mg).  Onions, raw,  cup (120 mg).  Peach, 1 each (185 mg).  Peaches, canned,  cup (120 mg).  Pears, canned,  cup (120 mg).  Peas, green, frozen,  cup (90 mg).  Peppers, green,  cup (130 mg).  Peppers, red,  cup (160 mg).  Pineapple juice,  cup (165 mg).  Pineapple, fresh or canned,  cup (100 mg).  Plums, 1 each (105 mg).  Pudding, vanilla,  cup (150 mg).  Raspberries,  cup (90 mg).  Rhubarb,  cup (115 mg).  Rice, wild,  cup (80 mg).  Shrimp, 3 oz (155 mg).  Spinach, raw, 1 cup (170 mg).  Strawberries,  cup (125 mg).  Summer squash   cup (175-200 mg).  Swiss chard, raw, 1 cup (135 mg).  Tangerines, 1 each (140 mg).  Tea, brewed, 6 oz (65 mg).  Turnips,  cup (140 mg).  Watermelon,  cup (85 mg).  Wine, red, table, 5 oz (180 mg).  Wine, white, table, 5 oz (100 mg). LOW IN POTASSIUM The following foods and beverages have less than 50 mg of potassium per serving.  Bread, white, 1 slice (30 mg).  Carbonated beverages, 12 oz (less than 5 mg).  Cheese, 1 oz (20-30 mg).  Cranberries,  cup (45 mg).  Cranberry juice cocktail,  cup (20 mg).  Fats and oils, 1 Tbsp (less than 5 mg).  Hummus, 1 Tbsp (32 mg).  Nectar: papaya, mango, or pear,  cup (35 mg).  Rice, white or brown,  cup (50 mg).  Spaghetti or macaroni,  cup cooked (30 mg).  Tortilla, flour or corn, 1 each (50 mg).  Waffle, 4 in., 1 each (50 mg).  Water chestnuts,  cup (40 mg).   This information is not intended to replace advice given to you by your health care provider. Make sure you discuss any questions you have with your health care provider.   Document Released: 08/30/2004 Document Revised: 01/21/2013 Document Reviewed: 12/13/2012 Elsevier Interactive Patient Education [employer]

## 2015-06-06 NOTE — ED Notes (Signed)
Patient transported to CT 

## 2015-06-06 NOTE — ED Notes (Signed)
C/o epigastric pain onset yesterday morning,   nausea, vomiting, diarhhea started approx 1 hour later.

## 2015-06-29 ENCOUNTER — Other Ambulatory Visit: Payer: Self-pay | Admitting: Internal Medicine

## 2015-06-30 NOTE — Telephone Encounter (Signed)
Ok to refill 6 months 

## 2015-06-30 NOTE — Telephone Encounter (Signed)
CY Please advise on refill. Thanks.  

## 2015-08-15 ENCOUNTER — Other Ambulatory Visit: Payer: Self-pay | Admitting: Internal Medicine

## 2015-10-28 ENCOUNTER — Ambulatory Visit (INDEPENDENT_AMBULATORY_CARE_PROVIDER_SITE_OTHER): Payer: PRIVATE HEALTH INSURANCE | Admitting: Internal Medicine

## 2015-10-28 ENCOUNTER — Encounter: Payer: Self-pay | Admitting: Internal Medicine

## 2015-10-28 DIAGNOSIS — G4710 Hypersomnia, unspecified: Secondary | ICD-10-CM | POA: Diagnosis not present

## 2015-10-28 DIAGNOSIS — R0683 Snoring: Secondary | ICD-10-CM | POA: Diagnosis not present

## 2015-10-28 DIAGNOSIS — J45909 Unspecified asthma, uncomplicated: Secondary | ICD-10-CM

## 2015-10-28 MED ORDER — METHYLPHENIDATE HCL ER 20 MG PO TBCR: EXTENDED_RELEASE_TABLET | ORAL | 0 refills | Status: AC

## 2015-10-28 NOTE — Assessment & Plan Note (Signed)
She is alone with no bloody complaining

## 2015-10-28 NOTE — Patient Instructions (Signed)
Ritalin refilled  Consider seeing how you sleep at night if you only take 1/2 tab of your ritalin/ methylphenidate that day  See if you sleep better taking a whole 10 mg ambien at t bedtime  Please call as needed

## 2015-10-28 NOTE — Assessment & Plan Note (Signed)
We discussed issues of Ritalin promote daytime wakefulness and Ambien to promote nighttime sleepiness Plan-try half dose Ritalin/half tablet in the daytime to reduce any carry overstimulation at night. Try increasing nighttime Ambien to 10 mg for trial with observation. Good sleep habits reinforced.

## 2015-10-28 NOTE — Assessment & Plan Note (Signed)
Mild persistent uncomplicated well-controlled currently. Discussed potential causes for exacerbation in October-mainly viral by that time. Discussed use of maintenance controller inhaler

## 2015-10-28 NOTE — Progress Notes (Signed)
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 Pt c/o increased cough with yellow mucus, wheezing, SOB and chest tightness since yesterday. Pt has been using albuterol HFA with some symptom relief. Pt also recently lost her job and is having trouble sleeping and is not very active during the day.  Not using her Metadate-ER since she is not working. Able to nap. Much stress causing insomnia now. We discussed basics and options. Has been wheezing and coughing some, needing rescue inhaler more often with mild sleep disturbance. Has a nebulizer machine and Xopenex. Albuterol causes too much tremor. Does not have a maintenance controller.  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 Also, patient noted slight wheezing from questionable allergies.  methylphenidate  20 mg ER Bedtime 9:30 or 10. Falls asleep well but is wide awake at 4 AM. Ambien 5 mg helps but doesn't last long enough. Never tried full 10 mg tablet. Out of Ritalin for the last 10 days. She misses it but not really fighting daytime sleepiness badly. No cataplexy. Occasional Sunday nap. Asthma-some increase in usually in October. Continues Breo, using proventil rescue inhaler once daily  ROS- see HPI Constitutional:   No-   weight loss, night sweats, fevers, chills, +fatigue, lassitude. HEENT:   +headaches, difficulty swallowing, tooth/dental problems, sore throat,       No-  sneezing, itching, ear ache, nasal congestion, post nasal drip,  CV:  No-   chest pain, orthopnea, PND, swelling in lower extremities, anasarca, dizziness,  palpitations Resp: No-   shortness of breath with exertion or at rest.               productive cough,  No non-productive cough,  No- coughing up of blood.              change in color of mucus.  + wheezing.   Skin: No-   rash or lesions. GI:  +heartburn, indigestion, abdominal pain, nausea, vomiting,  GU:  MS:  No-   joint pain or swelling.  . Neuro-     nothing unusual Psych:  No- change in mood or affect. No depression +anxiety.  No memory loss.  OBJ- Physical Exam General- Oriented, Affect-appropriate, Distress- none acute, + obese Skin- rash-none, lesions- none, excoriation- none Lymphadenopathy- none Head- atraumatic            Eyes- Gross vision intact, PERRLA, conjunctivae and secretions clear            Ears- Hearing, canals-normal            Nose- + stuffy, no-Septal dev, mucus, polyps, erosion, perforation             Throat- Mallampati III-IV , mucosa clear , drainage- none, tonsils- atrophic Neck- flexible , trachea midline, no stridor , thyroid nl, carotid no bruit Chest - symmetrical excursion , unlabored           Heart/CV- RRR , no murmur , no gallop  , no rub, nl s1 s2                           - JVD- none , edema- none, stasis changes- none, varices- none  Lung-  wheeze -none, cough -none , dullness-none, rub- none           Chest wall-  Abd-  Br/ Gen/ Rectal- Not done, not indicated Extrem- cyanosis- none, clubbing, none, atrophy- none, strength- nl Neuro- grossly intact to observation

## 2015-12-14 ENCOUNTER — Encounter (HOSPITAL_COMMUNITY): Payer: Self-pay

## 2016-06-29 ENCOUNTER — Ambulatory Visit: Payer: Self-pay | Admitting: General Surgery

## 2016-07-10 ENCOUNTER — Encounter (HOSPITAL_COMMUNITY): Payer: Self-pay | Admitting: *Deleted

## 2016-07-17 ENCOUNTER — Encounter (HOSPITAL_COMMUNITY): Payer: PRIVATE HEALTH INSURANCE

## 2016-07-18 ENCOUNTER — Ambulatory Visit (HOSPITAL_COMMUNITY)
Admission: RE | Admit: 2016-07-18 | Payer: PRIVATE HEALTH INSURANCE | Source: Ambulatory Visit | Admitting: General Surgery

## 2016-07-18 ENCOUNTER — Encounter (HOSPITAL_COMMUNITY): Admission: RE | Payer: Self-pay | Source: Ambulatory Visit

## 2016-07-18 SURGERY — REMOVAL, GASTRIC BAND, LAPAROSCOPIC
Anesthesia: General

## 2017-05-17 DIAGNOSIS — R531 Weakness: Secondary | ICD-10-CM | POA: Diagnosis not present

## 2017-05-17 DIAGNOSIS — E876 Hypokalemia: Secondary | ICD-10-CM | POA: Diagnosis not present

## 2017-05-17 DIAGNOSIS — I10 Essential (primary) hypertension: Secondary | ICD-10-CM | POA: Diagnosis not present

## 2017-05-18 DIAGNOSIS — E876 Hypokalemia: Secondary | ICD-10-CM | POA: Diagnosis not present

## 2017-05-18 DIAGNOSIS — I10 Essential (primary) hypertension: Secondary | ICD-10-CM | POA: Diagnosis not present

## 2017-05-18 DIAGNOSIS — R531 Weakness: Secondary | ICD-10-CM | POA: Diagnosis not present

## 2020-05-23 ENCOUNTER — Emergency Department: Payer: PRIVATE HEALTH INSURANCE

## 2020-05-23 ENCOUNTER — Other Ambulatory Visit: Payer: Self-pay

## 2020-05-23 ENCOUNTER — Emergency Department
Admission: EM | Admit: 2020-05-23 | Discharge: 2020-05-23 | Disposition: A | Discharge status: Home / Self Care | Payer: PRIVATE HEALTH INSURANCE | Attending: Emergency Medicine | Admitting: Emergency Medicine

## 2020-05-23 ENCOUNTER — Encounter: Payer: Self-pay | Admitting: Emergency Medicine

## 2020-05-23 DIAGNOSIS — S82822A Torus fracture of lower end of left fibula, initial encounter for closed fracture: Secondary | ICD-10-CM | POA: Diagnosis not present

## 2020-05-23 DIAGNOSIS — S52502A Unspecified fracture of the lower end of left radius, initial encounter for closed fracture: Secondary | ICD-10-CM | POA: Diagnosis not present

## 2020-05-23 DIAGNOSIS — S82821A Torus fracture of lower end of right fibula, initial encounter for closed fracture: Secondary | ICD-10-CM

## 2020-05-23 DIAGNOSIS — S6992XA Unspecified injury of left wrist, hand and finger(s), initial encounter: Secondary | ICD-10-CM | POA: Diagnosis present

## 2020-05-23 DIAGNOSIS — Z79899 Other long term (current) drug therapy: Secondary | ICD-10-CM | POA: Diagnosis not present

## 2020-05-23 DIAGNOSIS — I10 Essential (primary) hypertension: Secondary | ICD-10-CM | POA: Diagnosis not present

## 2020-05-23 DIAGNOSIS — J45909 Unspecified asthma, uncomplicated: Secondary | ICD-10-CM | POA: Diagnosis not present

## 2020-05-23 DIAGNOSIS — S62102A Fracture of unspecified carpal bone, left wrist, initial encounter for closed fracture: Secondary | ICD-10-CM

## 2020-05-23 DIAGNOSIS — W07XXXA Fall from chair, initial encounter: Secondary | ICD-10-CM | POA: Insufficient documentation

## 2020-05-23 DIAGNOSIS — Z7982 Long term (current) use of aspirin: Secondary | ICD-10-CM | POA: Insufficient documentation

## 2020-05-23 LAB — CBC WITH DIFFERENTIAL/PLATELET
Abs Immature Granulocytes: 0.07 10*3/uL (ref 0.00–0.07)
Basophils Absolute: 0 10*3/uL (ref 0.0–0.1)
Basophils Relative: 0 %
Eosinophils Absolute: 0 10*3/uL (ref 0.0–0.5)
Eosinophils Relative: 0 %
HCT: 36.6 % (ref 36.0–46.0)
Hemoglobin: 11 g/dL — ABNORMAL LOW (ref 12.0–15.0)
Immature Granulocytes: 1 %
Lymphocytes Relative: 18 %
Lymphs Abs: 2.3 10*3/uL (ref 0.7–4.0)
MCH: 25.1 pg — ABNORMAL LOW (ref 26.0–34.0)
MCHC: 30.1 g/dL (ref 30.0–36.0)
MCV: 83.4 fL (ref 80.0–100.0)
Monocytes Absolute: 0.7 10*3/uL (ref 0.1–1.0)
Monocytes Relative: 6 %
Neutro Abs: 9.3 10*3/uL — ABNORMAL HIGH (ref 1.7–7.7)
Neutrophils Relative %: 75 %
Platelets: 375 10*3/uL (ref 150–400)
RBC: 4.39 MIL/uL (ref 3.87–5.11)
RDW: 19 % — ABNORMAL HIGH (ref 11.5–15.5)
WBC: 12.3 10*3/uL — ABNORMAL HIGH (ref 4.0–10.5)
nRBC: 0 % (ref 0.0–0.2)

## 2020-05-23 LAB — BASIC METABOLIC PANEL
Anion gap: 8 (ref 5–15)
BUN: 23 mg/dL — ABNORMAL HIGH (ref 6–20)
CO2: 21 mmol/L — ABNORMAL LOW (ref 22–32)
Calcium: 8.9 mg/dL (ref 8.9–10.3)
Chloride: 111 mmol/L (ref 98–111)
Creatinine, Ser: 1.02 mg/dL — ABNORMAL HIGH (ref 0.44–1.00)
GFR, Estimated: >60 mL/min (ref >60)
Glucose, Bld: 116 mg/dL — ABNORMAL HIGH (ref 70–99)
Potassium: 3.7 mmol/L (ref 3.5–5.1)
Sodium: 140 mmol/L (ref 135–145)

## 2020-05-23 MED ORDER — HYDROMORPHONE HCL 1 MG/ML IJ SOLN
1.0000 mg | INTRAMUSCULAR | Status: AC
  Administered 2020-05-23: 1 mg via INTRAVENOUS
  Filled 2020-05-23: qty 1

## 2020-05-23 MED ORDER — ONDANSETRON HCL 4 MG/2ML IJ SOLN
4.0000 mg | Freq: Once | INTRAMUSCULAR | Status: AC
  Administered 2020-05-23: 4 mg via INTRAVENOUS
  Filled 2020-05-23: qty 2

## 2020-05-23 MED ORDER — HYDROMORPHONE HCL 1 MG/ML IJ SOLN
1.0000 mg | Freq: Once | INTRAMUSCULAR | Status: AC
  Administered 2020-05-23: 1 mg via INTRAVENOUS
  Filled 2020-05-23: qty 1

## 2020-05-23 MED ORDER — OXYCODONE-ACETAMINOPHEN 7.5-325 MG PO TABS: 1.0000 | ORAL_TABLET | Freq: Four times a day (QID) | ORAL | 0 refills | Status: AC | PRN

## 2020-05-23 MED ORDER — LIDOCAINE HCL (PF) 1 % IJ SOLN
10.0000 mL | Freq: Once | INTRAMUSCULAR | Status: AC
  Administered 2020-05-23: 10 mL
  Filled 2020-05-23: qty 10

## 2020-05-23 MED ORDER — MORPHINE SULFATE (PF) 4 MG/ML IV SOLN
4.0000 mg | Freq: Once | INTRAVENOUS | Status: AC
  Administered 2020-05-23: 4 mg via INTRAVENOUS
  Filled 2020-05-23: qty 1

## 2020-05-23 NOTE — ED Provider Notes (Signed)
[facility] Emergency Department Provider Note   ____________________________________________   None    (approximate)  I have reviewed the triage vital signs and the nursing notes.   HISTORY  Chief Complaint Fall    HPI Natasha [name] is a 50 y.o. female patient presents with obvious deformity to the left wrist and swelling to the left ankle secondary to a fall.  She was standing on a chair when she lost balance and fell.  Patient rates her pain as a 10/10.  Patient denies loss of sensation.  Decreased range of motion of the left wrist.  Patient has full full range of motion to grimace of pain of the left ankle.  Patient denies LOC or head injury.  Patient denies neck pain, chest pain, abdominal pain, or back pain.  No palliative measure prior to arrival.  Patient described pain as "achy".         Past Medical History:  Diagnosis Date  . Anxiety   . Asthma   . Chest pain on exertion 08/2010; 05/02/2012  . Degenerative joint disease    "lower back" (05/02/2012)  . Depression   . GERD (gastroesophageal reflux disease)   . Hypertension   . Iron deficiency anemia   . Kidney stones    "never had OR" (05/02/2012)  . Migraine   . Obesity   . Pneumonia 2000's  . PONV (postoperative nausea and vomiting)     Patient Active Problem List   Diagnosis Date Noted  . Insomnia 04/27/2015  . Asthmatic bronchitis without complication 10/23/2014  . Hypersomnia 12/28/2013  . Snoring without sleep apnea 08/12/2013  . Chest pain 05/02/2012  . Asthmatic bronchitis with exacerbation 05/02/2012  . GERD (gastroesophageal reflux disease) 05/02/2012  . Anxiety 05/02/2012  . Iron deficiency anemia 12/08/2010    Past Surgical History:  Procedure Laterality Date  . BREAST REDUCTION SURGERY Bilateral 2001  . CESAREAN SECTION  2004  . CHOLECYSTECTOMY  2008  . IUD REMOVAL  2010  . LAPAROSCOPIC GASTRIC BANDING  2009  . LUMBAR MICRODISCECTOMY  2006    Prior to Admission  medications   Medication Sig Start Date End Date Taking? Authorizing Provider  oxyCODONE-acetaminophen (PERCOCET) 7.5-325 MG tablet Take 1 tablet by mouth every 6 (six) hours as needed for severe pain. 05/23/20  Yes Joni Reining, PA-C  albuterol (PROVENTIL HFA;VENTOLIN HFA) 108 (90 BASE) MCG/ACT inhaler Inhale 2 puffs into the lungs every 6 (six) hours as needed for wheezing.    [provider]  aspirin EC 81 MG EC tablet Take 1 tablet (81 mg total) by mouth daily. 05/03/12   Myrlene Broker, MD  citalopram (CELEXA) 40 MG tablet Take 40 mg by mouth daily.    [provider]  fluticasone furoate-vilanterol (BREO ELLIPTA) 100-25 MCG/INH AEPB Inhale 1 puff into the lungs daily. 04/26/15   Waymon Budge, MD  methylphenidate (METADATE ER) 20 MG ER tablet 1 twice daily as directed 10/28/15   Jetty Duhamel D, MD  montelukast (SINGULAIR) 10 MG tablet Take 10 mg by mouth at bedtime.    [provider]  ondansetron (ZOFRAN ODT) 4 MG disintegrating tablet Allow 1-2 tablets to dissolve in your mouth every 8 hours as needed for nausea/vomiting 06/06/15   Loleta Rose, MD  pantoprazole (PROTONIX) 40 MG tablet Take 1 tablet by mouth daily.    [provider]  predniSONE (DELTASONE) 10 MG tablet Take 4 tabs daily x 2 days, 3 tabs daily x 2 days, 2  tabs daily x 2 days, 1 tab daily x 2 days then stop 04/29/15   Jetty Duhamel D, MD  rizatriptan (MAXALT) 10 MG tablet Take 1 tablet by mouth as needed.    [provider]  topiramate (TOPAMAX) 100 MG tablet Take 300 mg by mouth 2 (two) times daily.    [provider]  valACYclovir (VALTREX) 500 MG tablet Take 1 tablet by mouth as needed.    [provider]  zolpidem (AMBIEN) 10 MG tablet TAKE A HALF TABLET BY MOUTH AT BEDTIME AS NEEDED FOR SLEEP 06/30/15   Waymon Budge, MD    Allergies Dipyridamole, Inderal [propranolol hcl], Midodrine, and Shellfish allergy  Family History  Problem Relation Age  of Onset  . Lupus Mother   . Heart disease Father     Social History Social History   Tobacco Use  . Smoking status: Never Smoker  . Smokeless tobacco: Never Used  Substance Use Topics  . Alcohol use: Yes    Comment: very rarely  . Drug use: No    Review of Systems Constitutional: No fever/chills Eyes: No visual changes. ENT: No sore throat. Cardiovascular: Denies chest pain. Respiratory: Denies shortness of breath. Gastrointestinal: No abdominal pain.  No nausea, no vomiting.  No diarrhea.  No constipation. Genitourinary: Negative for dysuria. Musculoskeletal: Left wrist and ankle pain. Skin: Negative for rash. Neurological: Negative for headaches, focal weakness or numbness. Psychiatric:  Anxiety, Endocrine:  Hyperlipidemia, Hematological/Lymphatic:  Anemia Allergic/Immunilogical: Hypertension medications and shellfish.  ____________________________________________   PHYSICAL EXAM:  VITAL SIGNS: ED Triage Vitals [05/23/20 1305]  Enc Vitals Group     BP (!) 145/93     Pulse Rate (!) 51     Resp 18     Temp 97.8 F (36.6 C)     Temp Source Oral     SpO2 100 %     Weight 260 lb (117.9 kg)     Height 5\' 5"  (1.651 m)     Head Circumference      Peak Flow      Pain Score 10     Pain Loc      Pain Edu?      Excl. in GC?     Constitutional: Alert and oriented. Well appearing and in no acute distress. Eyes: Conjunctivae are normal. PERRL. EOMI. Head: Atraumatic. Nose: No congestion/rhinnorhea. Mouth/Throat: Mucous membranes are moist.  Oropharynx non-erythematous. Neck: No stridor.  No cervical spine tenderness to palpation. Hematological/Lymphatic/Immunilogical: No cervical lymphadenopathy. Cardiovascular: Tachycardic regular rhythm. Grossly normal heart sounds.  Good peripheral circulation. Respiratory: Normal respiratory effort.  No retractions. Lungs CTAB. Gastrointestinal: Soft and nontender.  Distention secondary to body habitus.  No abdominal bruits.  No CVA tenderness. Genitourinary: Deferred Musculoskeletal: Obvious deformity to the left wrist.  There was no obvious deformity to the left ankle.  Moderate edema is appreciated. Neurologic:  Normal speech and language. No gross focal neurologic deficits are appreciated. No gait instability. Skin:  Skin is warm, dry and intact. No rash noted. Psychiatric: Mood and affect are normal. Speech and behavior are normal.  ____________________________________________   LABS (all labs ordered are listed, but only abnormal results are displayed)  Labs Reviewed  CBC WITH DIFFERENTIAL/PLATELET - Abnormal; Notable for the following components:      Result Value   WBC 12.3 (*)    Hemoglobin 11.0 (*)    MCH 25.1 (*)    RDW 19.0 (*)    Neutro Abs 9.3 (*)    All other  components within normal limits  BASIC METABOLIC PANEL - Abnormal; Notable for the following components:   CO2 21 (*)    Glucose, Bld 116 (*)    BUN 23 (*)    Creatinine, Ser 1.02 (*)    All other components within normal limits   ____________________________________________  EKG   ____________________________________________  RADIOLOGY I, Joni Reining, personally viewed and evaluated these images (plain radiographs) as part of my medical decision making, as well as reviewing the written report by the radiologist.  ED MD interpretation:    Official radiology report(s): DG Wrist Complete Left  Result Date: 05/23/2020 CLINICAL DATA:  Status post fall. EXAM: LEFT WRIST - COMPLETE 3+ VIEW COMPARISON:  None. FINDINGS: Acute, intra-articular, impacted, comminuted fracture of the distal radius identified. Marked overlying soft tissue swelling. No dislocation. IMPRESSION: Acute, intra-articular, impacted, comminuted fracture of the distal radius. Electronically Signed   By: Signa Kell M.D.   On: 05/23/2020 13:41   DG Ankle Complete Left  Result Date: 05/23/2020 CLINICAL DATA:  Fall. EXAM: LEFT ANKLE COMPLETE - 3+ VIEW  COMPARISON:  None. FINDINGS: There is marked lateral soft tissue swelling. Obliquely oriented fracture deformity involves the distal fibula. There is mild lateral and posterior displacement of the distal fracture fragments. There is widening of the ankle mortise. Mild soft tissue swelling is noted along the medial aspect of the ankle. Findings are suspicious for deltoid ligament injury. IMPRESSION: 1. Acute fracture involves the distal fibula. Mild lateral and posterior displacement of the distal fracture fragments. 2. Suspect medial deltoid ligament injury. Electronically Signed   By: Signa Kell M.D.   On: 05/23/2020 13:39    ____________________________________________   PROCEDURES  Procedure(s) performed (including Critical Care):  Procedures   ____________________________________________   INITIAL IMPRESSION / ASSESSMENT AND PLAN / ED COURSE  As part of my medical decision making, I reviewed the following data within the electronic MEDICAL RECORD NUMBER         Patient presents with deformity left wrist secondary to fall standing on a chair.  Patient also complaining left ankle pain from same incident.  Discussed x-ray findings with patient showing a comminuted intra-articular fracture of the left wrist and a distal fibular fracture.  Discussed wrist fracture with orthopedics who recommend reduction/splint and follow-up in office.  Patient also placed in a walking boot to assist with ambulation of the distal fibular fracture.  Advised with a combination of a walking boot and pain medication increased risk for fall.  Patient advised to contact orthopedic listed in discharge care instructions tomorrow morning to schedule appoint for definitive evaluation and treatment.      ____________________________________________   FINAL CLINICAL IMPRESSION(S) / ED DIAGNOSES  Final diagnoses:  Closed fracture of left wrist, initial encounter  Closed torus fracture of distal end of right  fibula, initial encounter     ED Discharge Orders         Ordered    oxyCODONE-acetaminophen (PERCOCET) 7.5-325 MG tablet  Every 6 hours PRN        05/23/20 1558          *Please note:  Trenee Igoe was evaluated in Emergency Department on 05/23/2020 for the symptoms described in the history of present illness. She was evaluated in the context of the global COVID-19 pandemic, which necessitated consideration that the patient might be at risk for infection with the SARS-CoV-2 virus that causes COVID-19. Institutional protocols and algorithms that pertain to the evaluation of patients at risk for COVID-19  are in a state of rapid change based on information released by regulatory bodies including the CDC and federal and state organizations. These policies and algorithms were followed during the patient's care in the ED.  Some ED evaluations and interventions may be delayed as a result of limited staffing during and the pandemic.*   Note:  This document was prepared using Dragon voice recognition software and may include unintentional dictation errors.    Joni ReiningSmith, Travonne Schowalter K, PA-C 05/23/20 1618    Delton PrairieSmith, Dylan, MD 05/24/20 [id number]

## 2020-05-23 NOTE — ED Triage Notes (Signed)
Pt fell while standing on chair. Swelling to left ankle. DP pulse left side WNL.  Pt has pain to left wrist with deformity.  Radial pulse left side WNL

## 2020-05-23 NOTE — Discharge Instructions (Addendum)
Wear splint and ambulate in walking boot until evaluation by orthopedic.  Call Monday morning after 830 and tell them you are follow-up from the emergency room unable to schedule you for an appointment.  Be advised pain medication may cause drowsiness.

## 2020-05-23 NOTE — ED Provider Notes (Signed)
Hematoma block and fracture reduction performed by me after obtaining verbal consent from patient  .Ortho Injury Treatment  Date/Time: 05/23/2020 4:14 PM Performed by: Jene Every, MD Authorized by: Jene Every, MD   Consent:    Consent obtained:  Verbal   Consent given by:  Patient   Risks discussed:  Fracture, nerve damage, vascular damage, recurrent dislocation and stiffnessInjury location: wrist Location details: left wrist Injury type: fracture Fracture type: distal radius Pre-procedure neurovascular assessment: neurovascularly intact Pre-procedure distal perfusion: normal Pre-procedure neurological function: normal Pre-procedure range of motion: normal Anesthesia: hematoma block  Anesthesia: Local anesthesia used: yes Local Anesthetic: lidocaine 1% without epinephrine Anesthetic total: 7 mL  Patient sedated: NoManipulation performed: yes Reduction successful: yes Splint type: volar short arm Splint Applied by: ED Provider Supplies used: cotton padding,  elastic bandage and Ortho-Glass Post-procedure neurovascular assessment: post-procedure neurovascularly intact Post-procedure distal perfusion: normal Post-procedure neurological function: normal Post-procedure range of motion: normal       Jene Every, MD 05/23/20 1615

## 2020-05-23 NOTE — ED Notes (Signed)
CAM boot applied to left foot. Pt verbalized understanding of use.
